# Patient Record
Sex: Female | Born: 1935 | Race: White | Hispanic: No | Smoking: Never smoker
Health system: Southern US, Community
[De-identification: ages and names within clinical notes are randomized; demographics above are authoritative.]

## PROBLEM LIST (undated history)

## (undated) DIAGNOSIS — Z9289 Personal history of other medical treatment: Secondary | ICD-10-CM

## (undated) DIAGNOSIS — N189 Chronic kidney disease, unspecified: Secondary | ICD-10-CM

## (undated) DIAGNOSIS — C801 Malignant (primary) neoplasm, unspecified: Secondary | ICD-10-CM

## (undated) DIAGNOSIS — C4490 Unspecified malignant neoplasm of skin, unspecified: Secondary | ICD-10-CM

## (undated) DIAGNOSIS — I10 Essential (primary) hypertension: Secondary | ICD-10-CM

## (undated) DIAGNOSIS — H409 Unspecified glaucoma: Secondary | ICD-10-CM

## (undated) DIAGNOSIS — K219 Gastro-esophageal reflux disease without esophagitis: Secondary | ICD-10-CM

## (undated) DIAGNOSIS — K222 Esophageal obstruction: Principal | ICD-10-CM

## (undated) HISTORY — PX: COLON SURGERY: SHX602

## (undated) HISTORY — DX: Essential (primary) hypertension: I10

## (undated) HISTORY — DX: Unspecified malignant neoplasm of skin, unspecified: C44.90

## (undated) HISTORY — PX: BREAST EXCISIONAL BIOPSY: SUR124

## (undated) HISTORY — DX: Chronic kidney disease, unspecified: N18.9

## (undated) HISTORY — DX: Gastro-esophageal reflux disease without esophagitis: K21.9

## (undated) HISTORY — DX: Esophageal obstruction: K22.2

## (undated) HISTORY — DX: Personal history of other medical treatment: Z92.89

## (undated) HISTORY — PX: BREAST BIOPSY: SHX20

## (undated) HISTORY — DX: Malignant (primary) neoplasm, unspecified: C80.1

## (undated) HISTORY — DX: Unspecified glaucoma: H40.9

## (undated) HISTORY — PX: BLADDER SUSPENSION: SHX72

---
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
## 1960-06-16 HISTORY — PX: ABDOMINAL HYSTERECTOMY: SHX81

## 2004-05-08 ENCOUNTER — Emergency Department: Payer: Self-pay | Admitting: Internal Medicine

## 2004-05-08 ENCOUNTER — Other Ambulatory Visit: Payer: Self-pay

## 2004-05-15 ENCOUNTER — Ambulatory Visit: Payer: Self-pay | Admitting: Internal Medicine

## 2004-06-03 ENCOUNTER — Ambulatory Visit: Payer: Self-pay

## 2005-04-16 DIAGNOSIS — C4490 Unspecified malignant neoplasm of skin, unspecified: Secondary | ICD-10-CM

## 2005-04-16 HISTORY — DX: Unspecified malignant neoplasm of skin, unspecified: C44.90

## 2006-01-30 ENCOUNTER — Emergency Department: Payer: Self-pay | Admitting: Emergency Medicine

## 2006-01-30 ENCOUNTER — Other Ambulatory Visit: Payer: Self-pay

## 2006-06-16 HISTORY — PX: COLON RESECTION: SHX5231

## 2006-10-14 ENCOUNTER — Ambulatory Visit: Payer: Self-pay

## 2006-11-23 ENCOUNTER — Emergency Department: Payer: Self-pay | Admitting: Emergency Medicine

## 2006-12-08 ENCOUNTER — Ambulatory Visit: Payer: Self-pay | Admitting: Gastroenterology

## 2006-12-22 ENCOUNTER — Ambulatory Visit: Payer: Self-pay | Admitting: Surgery

## 2006-12-29 ENCOUNTER — Ambulatory Visit: Payer: Self-pay | Admitting: Surgery

## 2006-12-29 ENCOUNTER — Other Ambulatory Visit: Payer: Self-pay

## 2007-01-05 ENCOUNTER — Inpatient Hospital Stay: Payer: Self-pay | Admitting: Surgery

## 2007-03-11 LAB — HM COLONOSCOPY

## 2007-07-13 ENCOUNTER — Ambulatory Visit: Payer: Self-pay | Admitting: Internal Medicine

## 2007-07-16 ENCOUNTER — Ambulatory Visit: Payer: Self-pay | Admitting: Internal Medicine

## 2007-07-19 ENCOUNTER — Ambulatory Visit: Payer: Self-pay | Admitting: Internal Medicine

## 2007-08-01 ENCOUNTER — Other Ambulatory Visit: Payer: Self-pay

## 2007-08-02 ENCOUNTER — Inpatient Hospital Stay: Payer: Self-pay | Admitting: Internal Medicine

## 2007-12-21 ENCOUNTER — Ambulatory Visit: Payer: Self-pay | Admitting: Internal Medicine

## 2007-12-29 ENCOUNTER — Ambulatory Visit: Payer: Self-pay | Admitting: Internal Medicine

## 2008-05-06 ENCOUNTER — Ambulatory Visit: Payer: Self-pay | Admitting: Unknown Physician Specialty

## 2008-12-04 ENCOUNTER — Ambulatory Visit: Payer: Self-pay | Admitting: Gastroenterology

## 2009-01-07 LAB — HM COLONOSCOPY: HM Colonoscopy: 2

## 2009-11-16 ENCOUNTER — Ambulatory Visit: Payer: Self-pay | Admitting: Internal Medicine

## 2010-03-20 ENCOUNTER — Ambulatory Visit: Payer: Self-pay | Admitting: Internal Medicine

## 2011-01-26 ENCOUNTER — Emergency Department: Payer: Self-pay | Admitting: Emergency Medicine

## 2011-02-03 ENCOUNTER — Encounter: Payer: Self-pay | Admitting: Internal Medicine

## 2011-02-03 ENCOUNTER — Ambulatory Visit (INDEPENDENT_AMBULATORY_CARE_PROVIDER_SITE_OTHER): Payer: Medicare Other | Admitting: Internal Medicine

## 2011-02-03 VITALS — BP 130/60 | HR 74 | Temp 97.6°F | Resp 12 | Ht 65.0 in | Wt 122.8 lb

## 2011-02-03 DIAGNOSIS — N289 Disorder of kidney and ureter, unspecified: Secondary | ICD-10-CM | POA: Insufficient documentation

## 2011-02-03 DIAGNOSIS — N3090 Cystitis, unspecified without hematuria: Secondary | ICD-10-CM

## 2011-02-03 DIAGNOSIS — D649 Anemia, unspecified: Secondary | ICD-10-CM

## 2011-02-03 LAB — POCT URINALYSIS DIPSTICK
Bilirubin, UA: NEGATIVE
Glucose, UA: NEGATIVE
Ketones, UA: NEGATIVE
Spec Grav, UA: 1.015

## 2011-02-03 MED ORDER — LORAZEPAM 0.5 MG PO TABS: 0.2500 mg | ORAL_TABLET | Freq: Two times a day (BID) | ORAL | Status: DC | PRN

## 2011-02-03 NOTE — Patient Instructions (Signed)
stop the triamterene.  It is causing dehydration. You need to drink more fluids. Take the lorazepam for nausea and for trouble sleeping

## 2011-02-03 NOTE — Assessment & Plan Note (Signed)
Likely due to decreased po intake.  Stop diuretic, repeat labs in  2 weeks

## 2011-02-03 NOTE — Progress Notes (Signed)
  Subjective:    Patient ID: Rachel Reynolds, female    DOB: Feb 05, 1927, 77 y.o.   MRN: [id number]  HPI    Review of Systems     Objective:   Physical Exam        Assessment & Plan:   Subjective:     Rachel Reynolds is a 77 y.o. female who presents for evaluation of fatigue. Symptoms began a week ago. The patient feels the fatigue began with: sudden loss of husband Homero Fellers who died after discharge frombrief hospital stay  following blunt trauma to to the head . Symptoms of her fatigue have been anxiousness, change in appetite, feelings of depression, general malaise and lack of interest in usual activities. Patient describes the following psychological symptoms: depression. Patient denies loss of appetite, recurrent abdominal pain (chronic). Symptoms have waxed and waned. Symptom severity: symptoms bothersome, but easily able to carry out all usual work/school/family activities. Previous visits for this problem: multiple, this is a longstanding diagnosis. Last seen 3 months ago by me.   The following portions of the patient's history were reviewed and updated as appropriate: allergies, current medications, past family history, past medical history, past social history, past surgical history and problem list.  Review of Systems Pertinent items are noted in HPI.    Objective:    BP 130/60  Pulse 74  Temp(Src) 97.6 F (36.4 C) (Oral)  Resp 12  Wt 122 lb 12 oz (55.679 kg)  General Appearance:    Alert, cooperative, no distress, appears stated age  Head:    Normocephalic, without obvious abnormality, atraumatic  Eyes:    PERRL, conjunctiva/corneas clear, EOM's intact, fundi    benign, both eyes       Neck:   Supple, symmetrical, trachea midline, no adenopathy;       thyroid:  No enlargement/tenderness/nodules; no carotid   bruit or JVD  Back:     Symmetric, no curvature, ROM normal, no CVA tenderness  Lungs:     Clear to auscultation bilaterally, respirations unlabored    Chest wall:    No tenderness or deformity  Heart:    Regular rate and rhythm, S1 and S2 normal, no murmur, rub   or gallop  Abdomen:     Soft, non-tender, bowel sounds active all four quadrants,    no masses, no organomegaly  Extremities:   Extremities normal, atraumatic, no cyanosis or edema  Pulses:   2+ and symmetric all extremities  Skin:   Skin color, texture, turgor normal, no rashes or lesions  Lymph nodes:   Cervical, supraclavicular, and axillary nodes normal  Neurologic:   CNII-XII intact. Normal strength, sensation and reflexes      throughout      Assessment:    Fatigue, organic etiology unlikely based on careful history and exam. Anxiety is likely contributing to the problem.    Plan:  Trial of lorazepam for anxiety, nausea and insomnia.  Follow up in 2 weeks or as needed.

## 2011-02-03 NOTE — Assessment & Plan Note (Signed)
Found incidentally on ER visit with hgb 10.5.  Will defer workup for now given patients request.

## 2011-02-10 ENCOUNTER — Telehealth: Payer: Self-pay | Admitting: Internal Medicine

## 2011-02-10 NOTE — Telephone Encounter (Signed)
Patient says that since coming off of the triamterene her feet and ankles have been swelling. She is asking if she can go back on it. Please advise.

## 2011-02-10 NOTE — Telephone Encounter (Signed)
?   About meds  Sent to [facility]

## 2011-02-10 NOTE — Telephone Encounter (Signed)
She can resume the triamterene every other day for her swelling.

## 2011-02-11 NOTE — Telephone Encounter (Signed)
She can increase the lorazepam to 2 tablets at bedtime and continue 1 tablet during the day.  I cannot comment on the bp unless I know how high it is going,  But when she resumes the triamterene it should come down.

## 2011-02-11 NOTE — Telephone Encounter (Signed)
Notified patient she can resume the triamterene every other day for swelling.  She stated her anxiety is really giving her a hard time and she stays dizzy and "gittery" all the time.  She wanted to know what she needs to do.  Please advise.

## 2011-02-11 NOTE — Telephone Encounter (Signed)
Patient stated she has been taking the lorazepam 1/2 tablet two times a day, but last night she took a whole tablet and it still didn't help her sleep any longer.  She also wanted you to be aware that her BP has been elevated at night also.  Please advise.

## 2011-02-11 NOTE — Telephone Encounter (Signed)
She was prescribed lorazepam at last visit for anxiety.  Has she tried it?

## 2011-02-12 NOTE — Telephone Encounter (Signed)
Patient notified

## 2011-02-13 ENCOUNTER — Telehealth: Payer: Self-pay | Admitting: Internal Medicine

## 2011-02-20 ENCOUNTER — Telehealth: Payer: Self-pay | Admitting: Internal Medicine

## 2011-02-20 NOTE — Telephone Encounter (Signed)
Sherrilyn Rist from [place] called and stated she thinks patient needs more grief counseling, patient has lost more weight.  She wanted to know if you would be ok if [facility] could go out and see patient because they cover her insurance completley and she would not have to pay a co pay with them, like she does with [facility].  Please advise.

## 2011-02-21 ENCOUNTER — Ambulatory Visit: Payer: Medicare Other | Admitting: Internal Medicine

## 2011-02-21 NOTE — Telephone Encounter (Signed)
Absolutely.

## 2011-03-04 ENCOUNTER — Encounter: Payer: Self-pay | Admitting: Internal Medicine

## 2011-03-04 ENCOUNTER — Ambulatory Visit (INDEPENDENT_AMBULATORY_CARE_PROVIDER_SITE_OTHER): Payer: Medicare Other | Admitting: Internal Medicine

## 2011-03-04 DIAGNOSIS — F329 Major depressive disorder, single episode, unspecified: Secondary | ICD-10-CM

## 2011-03-04 DIAGNOSIS — R42 Dizziness and giddiness: Secondary | ICD-10-CM | POA: Insufficient documentation

## 2011-03-04 DIAGNOSIS — F4329 Adjustment disorder with other symptoms: Secondary | ICD-10-CM | POA: Insufficient documentation

## 2011-03-04 MED ORDER — LORAZEPAM 0.5 MG PO TABS: 0.2500 mg | ORAL_TABLET | Freq: Two times a day (BID) | ORAL | Status: DC | PRN

## 2011-03-04 NOTE — Progress Notes (Signed)
  Subjective:    Patient ID: Rachel Reynolds, female    DOB: May 24, 1927, 77 y.o.   MRN: [id number]  HPI  77 yo whte feamle here for 2 week followup on grief reaction due to sudden loss of her husband to [place].  Accompaniedd by Bertram Denver.  She is eating a little better. She refused the grief counseling services offered by [facility] because her son took her to the beach for a long weeken, but since she has returned, she is alone during the day,  Sits in the house with no music or television and cries all day.  Feels unsteady on her foot but has not fallen.      Review of Systems  Constitutional: Positive for appetite change.  Neurological: Positive for weakness and light-headedness.  Psychiatric/Behavioral: Positive for sleep disturbance.       Objective:   Physical Exam  Constitutional: She is oriented to person, place, and time. She appears well-developed and well-nourished.  HENT:  Mouth/Throat: Oropharynx is clear and moist.  Eyes: EOM are normal. Pupils are equal, round, and reactive to light. No scleral icterus.  Neck: Normal range of motion. Neck supple. No JVD present. No thyromegaly present.  Cardiovascular: Normal rate, regular rhythm, normal heart sounds and intact distal pulses.   Pulmonary/Chest: Effort normal and breath sounds normal.  Abdominal: Soft. Bowel sounds are normal. She exhibits no mass. There is no tenderness.  Musculoskeletal: Normal range of motion. She exhibits no edema.  Lymphadenopathy:    She has no cervical adenopathy.  Neurological: She is alert and oriented to person, place, and time.  Skin: Skin is warm and dry.  Psychiatric:       Grieving, tearful          Assessment & Plan:  Grief reaction :  Persistent ,  Has not accepted grief counseling yet,.  Will ask [facility] to offer services again.  coontinue lorazepam prescribed at last visit to help her rest at night since she states that it is not cuasing her to feel sedated  in the morning.  I have recommended that she have family memebers stay with her tduring the day to make sure she eats and has social outlet.  Light headedness:  Neuro and ENT exam are normal. She is not orthostatic by BP or pulse. .  Recommended using her walker until she is more active and steady on her feet.

## 2011-03-25 NOTE — Telephone Encounter (Signed)
Opened in error

## 2011-03-27 ENCOUNTER — Other Ambulatory Visit (INDEPENDENT_AMBULATORY_CARE_PROVIDER_SITE_OTHER): Payer: Medicare Other | Admitting: *Deleted

## 2011-03-27 ENCOUNTER — Telehealth: Payer: Self-pay | Admitting: Internal Medicine

## 2011-03-27 DIAGNOSIS — N390 Urinary tract infection, site not specified: Secondary | ICD-10-CM

## 2011-03-27 LAB — POCT URINALYSIS DIPSTICK
Blood, UA: NEGATIVE
Glucose, UA: NEGATIVE
Nitrite, UA: NEGATIVE
Urobilinogen, UA: 0.2
pH, UA: 7

## 2011-03-27 NOTE — Telephone Encounter (Signed)
Patient states she has a bladder infection could the doctor call in an antibiotic(Cipro).

## 2011-03-27 NOTE — Telephone Encounter (Signed)
Called patient back. Put her on nurse schedule to come in for urine sample.

## 2011-03-28 ENCOUNTER — Telehealth: Payer: Self-pay | Admitting: Internal Medicine

## 2011-03-28 NOTE — Telephone Encounter (Signed)
Notified patient of results 

## 2011-03-28 NOTE — Telephone Encounter (Signed)
Her urine was normal.  No signs of infection

## 2011-03-28 NOTE — Telephone Encounter (Signed)
Wanting lab results

## 2011-04-07 ENCOUNTER — Ambulatory Visit (INDEPENDENT_AMBULATORY_CARE_PROVIDER_SITE_OTHER): Payer: Medicare Other | Admitting: Internal Medicine

## 2011-04-07 ENCOUNTER — Encounter: Payer: Self-pay | Admitting: Internal Medicine

## 2011-04-07 ENCOUNTER — Telehealth: Payer: Self-pay | Admitting: *Deleted

## 2011-04-07 DIAGNOSIS — F329 Major depressive disorder, single episode, unspecified: Secondary | ICD-10-CM

## 2011-04-07 DIAGNOSIS — D649 Anemia, unspecified: Secondary | ICD-10-CM

## 2011-04-07 DIAGNOSIS — N289 Disorder of kidney and ureter, unspecified: Secondary | ICD-10-CM

## 2011-04-07 DIAGNOSIS — Z79899 Other long term (current) drug therapy: Secondary | ICD-10-CM

## 2011-04-07 DIAGNOSIS — F4329 Adjustment disorder with other symptoms: Secondary | ICD-10-CM

## 2011-04-07 DIAGNOSIS — Z862 Personal history of diseases of the blood and blood-forming organs and certain disorders involving the immune mechanism: Secondary | ICD-10-CM

## 2011-04-07 LAB — IRON AND TIBC: %SAT: 24 % (ref 20–55)

## 2011-04-07 MED ORDER — MIRTAZAPINE 7.5 MG PO TABS: 7.5000 mg | ORAL_TABLET | Freq: Every day | ORAL | Status: DC

## 2011-04-07 MED ORDER — MIRTAZAPINE 7.5 MG PO TABS: 7.5000 mg | ORAL_TABLET | Freq: Every day | ORAL | Status: AC

## 2011-04-07 NOTE — Assessment & Plan Note (Signed)
After 3 months she contineus to have poor appetite and malaise.  We discussed a trial of remeron. Starting at 7.5 mg daily at bedtime.  Encourage her to start a walking program and consider volunteering once a week but she doesn't feel she has anything to offer because she moves too slowly.

## 2011-04-07 NOTE — Telephone Encounter (Signed)
Patient says that she forgot to ask you about her hair loss at her appt today. She is asking if it could be from one of her meds, or if you think it is stress from the loss of her husband, or her thyroid.

## 2011-04-07 NOTE — Progress Notes (Signed)
  Subjective:    Patient ID: Rachel Reynolds, female    DOB: Apr 12, 1927, 77 y.o.   MRN: [id number]  HPI  77 yo wf returns for one month followup on complicated grief reaction following the death of her husband Rachel Reynolds several months ago.  She has not lost any more weight but continues tohave poor appetite, generalized weakness and no energy . Still keeping to herself a good bit but her son Rachel Reynolds has taken her out a few times.   Past Medical History  Diagnosis Date  . Hypertension   . Vaginal delivery     4  . Nonmelanoma skin cancer 11/06    precancerous actinic keratosis s/p liquid nitrogen, Dr. Gwen Pounds  . Cancer    Current Outpatient Prescriptions on File Prior to Visit  Medication Sig Dispense Refill  . acetaminophen (TYLENOL) 325 MG tablet Take 650 mg by mouth as needed.        Marland Kitchen aspirin 81 MG tablet Take 81 mg by mouth daily.        . calcium carbonate (OS-CAL) 600 MG TABS Take 600 mg by mouth 2 (two) times daily with a meal.        . enalapril (VASOTEC) 10 MG tablet Take 10 mg by mouth daily.        Marland Kitchen LORazepam (ATIVAN) 0.5 MG tablet Take 0.5-1 tablets (0.25-0.5 mg total) by mouth 2 (two) times daily as needed for anxiety (OR nausea). start with half a tablet  60 tablet  1  . omeprazole (PRILOSEC) 20 MG capsule Take 20 mg by mouth daily.        Marland Kitchen triamterene-hydrochlorothiazide (MAXZIDE-25) 37.5-25 MG per tablet Take 1 tablet by mouth daily.          Review of Systems  Constitutional: Positive for appetite change. Negative for unexpected weight change.  Gastrointestinal: Positive for diarrhea and abdominal distention.  Genitourinary: Positive for decreased urine volume.  Neurological: Positive for weakness and light-headedness.  Psychiatric/Behavioral: Positive for sleep disturbance.       Objective:   Physical Exam  Constitutional: She is oriented to person, place, and time. She appears well-developed and well-nourished.  HENT:  Mouth/Throat: Oropharynx is clear and  moist.  Eyes: EOM are normal. Pupils are equal, round, and reactive to light. No scleral icterus.  Neck: Normal range of motion. Neck supple. No JVD present. No thyromegaly present.  Cardiovascular: Normal rate, regular rhythm, normal heart sounds and intact distal pulses.   Pulmonary/Chest: Effort normal and breath sounds normal.  Abdominal: Soft. Bowel sounds are normal. She exhibits no mass. There is no tenderness.  Musculoskeletal: Normal range of motion. She exhibits no edema.  Lymphadenopathy:    She has no cervical adenopathy.  Neurological: She is alert and oriented to person, place, and time.  Skin: Skin is warm and dry.  Psychiatric:       Grieving, tearful          Assessment & Plan:

## 2011-04-07 NOTE — Telephone Encounter (Signed)
Hair loss can be due to thyroid, which we are checking today,  But also due to stress.

## 2011-04-07 NOTE — Telephone Encounter (Signed)
Patient notified

## 2011-04-07 NOTE — Patient Instructions (Signed)
i am recommending that you start mirtazipine 7.5 mg at bedtime .  This is for depression and poor appetite and low energy  Do not take the lorazepam at bedtime until you know how the mirtazipine affects you.  Return in one month

## 2011-04-08 LAB — CBC WITH DIFFERENTIAL/PLATELET
Basophils Relative: 0.3 % (ref 0.0–3.0)
Eosinophils Relative: 0.9 % (ref 0.0–5.0)
HCT: 31.7 % — ABNORMAL LOW (ref 36.0–46.0)
Hemoglobin: 11 g/dL — ABNORMAL LOW (ref 12.0–15.0)
Lymphs Abs: 1.2 10*3/uL (ref 0.7–4.0)
Monocytes Relative: 8.1 % (ref 3.0–12.0)
Neutro Abs: 3.1 10*3/uL (ref 1.4–7.7)
WBC: 4.8 10*3/uL (ref 4.5–10.5)

## 2011-04-08 LAB — BASIC METABOLIC PANEL
BUN: 30 mg/dL — ABNORMAL HIGH (ref 6–23)
CO2: 30 mEq/L (ref 19–32)
Chloride: 101 mEq/L (ref 96–112)
Creatinine, Ser: 1.3 mg/dL — ABNORMAL HIGH (ref 0.4–1.2)

## 2011-04-08 LAB — TSH: TSH: 0.52 u[IU]/mL (ref 0.35–5.50)

## 2011-04-09 ENCOUNTER — Other Ambulatory Visit: Payer: Self-pay | Admitting: Internal Medicine

## 2011-04-09 DIAGNOSIS — N179 Acute kidney failure, unspecified: Secondary | ICD-10-CM

## 2011-04-10 ENCOUNTER — Telehealth: Payer: Self-pay | Admitting: Internal Medicine

## 2011-04-10 NOTE — Telephone Encounter (Signed)
Patient called the rx for mirtazapine 75mg  you gave her was to strong.  She was supposed to take 1 tablet at bedtime  and she took 1/2 and it made her sleep till 9am the next morning she went  About 10.

## 2011-04-10 NOTE — Telephone Encounter (Signed)
Ask her to take it at 8 PM,  Half a tablet, and give it a week before she gives up on it.  The sedation should get better. She should not taker her lorazepam in the evenign while she is trying this/

## 2011-04-10 NOTE — Telephone Encounter (Signed)
Notified patient of the message 

## 2011-04-17 DIAGNOSIS — Z9289 Personal history of other medical treatment: Secondary | ICD-10-CM

## 2011-04-17 HISTORY — DX: Personal history of other medical treatment: Z92.89

## 2011-04-19 ENCOUNTER — Emergency Department: Payer: Self-pay | Admitting: Emergency Medicine

## 2011-04-24 ENCOUNTER — Other Ambulatory Visit: Payer: Medicare Other

## 2011-04-24 ENCOUNTER — Encounter: Payer: Self-pay | Admitting: Internal Medicine

## 2011-04-25 ENCOUNTER — Other Ambulatory Visit (INDEPENDENT_AMBULATORY_CARE_PROVIDER_SITE_OTHER): Payer: Medicare Other | Admitting: *Deleted

## 2011-04-25 DIAGNOSIS — N289 Disorder of kidney and ureter, unspecified: Secondary | ICD-10-CM

## 2011-04-25 DIAGNOSIS — N179 Acute kidney failure, unspecified: Secondary | ICD-10-CM

## 2011-04-25 LAB — CBC WITH DIFFERENTIAL/PLATELET
Basophils Relative: 0.4 % (ref 0.0–3.0)
Eosinophils Absolute: 0.1 10*3/uL (ref 0.0–0.7)
HCT: 32.4 % — ABNORMAL LOW (ref 36.0–46.0)
Hemoglobin: 11.1 g/dL — ABNORMAL LOW (ref 12.0–15.0)
Lymphocytes Relative: 23.2 % (ref 12.0–46.0)
MCHC: 34.4 g/dL (ref 30.0–36.0)
Neutro Abs: 3.3 10*3/uL (ref 1.4–7.7)
RBC: 3.48 Mil/uL — ABNORMAL LOW (ref 3.87–5.11)

## 2011-04-25 LAB — BASIC METABOLIC PANEL
BUN: 23 mg/dL (ref 6–23)
CO2: 26 mEq/L (ref 19–32)
Chloride: 103 mEq/L (ref 96–112)
Creatinine, Ser: 1.1 mg/dL (ref 0.4–1.2)

## 2011-04-26 ENCOUNTER — Inpatient Hospital Stay: Payer: Self-pay | Admitting: Specialist

## 2011-05-09 ENCOUNTER — Ambulatory Visit (INDEPENDENT_AMBULATORY_CARE_PROVIDER_SITE_OTHER): Payer: Medicare Other | Admitting: Internal Medicine

## 2011-05-09 ENCOUNTER — Encounter: Payer: Self-pay | Admitting: Internal Medicine

## 2011-05-09 ENCOUNTER — Ambulatory Visit: Payer: Medicare Other | Admitting: Internal Medicine

## 2011-05-09 VITALS — BP 150/79 | HR 76 | Temp 98.1°F | Resp 14 | Ht 61.0 in | Wt 122.5 lb

## 2011-05-09 DIAGNOSIS — N760 Acute vaginitis: Secondary | ICD-10-CM

## 2011-05-09 MED ORDER — FLUCONAZOLE 100 MG PO TABS: 150.0000 mg | ORAL_TABLET | Freq: Every day | ORAL | Status: AC

## 2011-05-09 NOTE — Patient Instructions (Addendum)
I think your headache and sore throat are coming from a viral infection.  I would like you to try alternating  Between tylenol Sinus and advil sinus for relief of congestion and headache  pain.  Also try gargling with salt water 2 or 3 times daily for the scratchy throat.    If you develop a cough,  You may use Delsym available over the counter.  If you develop fevers (t > 100.4 ) please call us back for an antibiotic
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
## 2011-05-12 ENCOUNTER — Other Ambulatory Visit: Payer: Self-pay | Admitting: Internal Medicine

## 2011-05-12 ENCOUNTER — Ambulatory Visit: Payer: Medicare Other | Admitting: Internal Medicine

## 2011-05-12 MED ORDER — ENALAPRIL MALEATE 10 MG PO TABS: 10.0000 mg | ORAL_TABLET | Freq: Every day | ORAL | Status: DC

## 2011-05-12 NOTE — Telephone Encounter (Signed)
Enalapril 10 mg authorized  #90 with 3 refills

## 2011-05-16 ENCOUNTER — Other Ambulatory Visit: Payer: Self-pay | Admitting: Internal Medicine

## 2011-07-14 ENCOUNTER — Encounter: Payer: Self-pay | Admitting: Internal Medicine

## 2011-07-14 ENCOUNTER — Ambulatory Visit (INDEPENDENT_AMBULATORY_CARE_PROVIDER_SITE_OTHER): Payer: Medicare Other | Admitting: Internal Medicine

## 2011-07-14 VITALS — BP 130/56 | HR 88 | Temp 98.5°F | Wt 122.0 lb

## 2011-07-14 DIAGNOSIS — J329 Chronic sinusitis, unspecified: Secondary | ICD-10-CM | POA: Insufficient documentation

## 2011-07-14 MED ORDER — AMOXICILLIN-POT CLAVULANATE 875-125 MG PO TABS: 1.0000 | ORAL_TABLET | Freq: Two times a day (BID) | ORAL | Status: AC

## 2011-07-14 NOTE — Assessment & Plan Note (Signed)
Symptoms are most consistent with frontal sinusitis. Will treat with Augmentin. Encouraged her to use Tylenol as needed for pain. Patient will call or return to clinic if symptoms are not improving in the next 72 hours.

## 2011-07-14 NOTE — Patient Instructions (Signed)

## 2011-07-14 NOTE — Progress Notes (Signed)
Subjective:    Patient ID: Rachel Reynolds, female    DOB: 02/08/1927, 78 y.o.   MRN: [id number]  HPI 78 year old female presents for an acute visit complaining of a three-day history of nasal congestion, purulent nasal drainage, and sinus pressure. She reports symptoms first began on Saturday. They started with facial pain. She then developed sinus congestion and drainage. She denies any fever or chills. She reports that symptoms are consistent with her previous history of sinusitis. She has occasional cough which is nonproductive. She denies shortness of breath. She denies any known sick contacts. She has not been taking any medication for her symptoms.  Outpatient Encounter Prescriptions as of 07/14/2011  Medication Sig Dispense Refill  . acetaminophen (TYLENOL) 325 MG tablet Take 650 mg by mouth as needed.        Marland Kitchen aspirin 81 MG tablet Take 81 mg by mouth daily.        . calcium carbonate (OS-CAL) 600 MG TABS Take 600 mg by mouth 2 (two) times daily with a meal.        . enalapril (VASOTEC) 10 MG tablet Take 1 tablet (10 mg total) by mouth daily.  90 tablet  2  . omeprazole (PRILOSEC) 20 MG capsule Take 20 mg by mouth daily.        Marland Kitchen triamterene-hydrochlorothiazide (MAXZIDE-25) 37.5-25 MG per tablet Take 1 tablet by mouth daily.        Marland Kitchen amoxicillin-clavulanate (AUGMENTIN) 875-125 MG per tablet Take 1 tablet by mouth 2 (two) times daily.  20 tablet  0    Review of Systems  Constitutional: Negative for fever, chills and unexpected weight change.  HENT: Positive for congestion and sinus pressure. Negative for hearing loss, ear pain, nosebleeds, sore throat, facial swelling, rhinorrhea, sneezing, mouth sores, trouble swallowing, neck pain, neck stiffness, voice change, postnasal drip, tinnitus and ear discharge.   Eyes: Negative for pain, discharge, redness and visual disturbance.  Respiratory: Positive for cough. Negative for chest tightness, shortness of breath, wheezing and stridor.     Cardiovascular: Negative for chest pain, palpitations and leg swelling.  Musculoskeletal: Negative for myalgias and arthralgias.  Skin: Negative for color change and rash.  Neurological: Positive for headaches. Negative for dizziness, weakness and light-headedness.  Hematological: Negative for adenopathy.   BP 130/56  Pulse 88  Temp(Src) 98.5 F (36.9 C) (Oral)  Wt 122 lb (55.339 kg)  SpO2 97%     Objective:   Physical Exam  Constitutional: She is oriented to person, place, and time. She appears well-developed and well-nourished. No distress.  HENT:  Head: Normocephalic and atraumatic.  Right Ear: External ear normal. A middle ear effusion is present.  Left Ear: External ear normal. A middle ear effusion is present.  Nose: Right sinus exhibits maxillary sinus tenderness and frontal sinus tenderness. Left sinus exhibits maxillary sinus tenderness and frontal sinus tenderness.  Mouth/Throat: Oropharynx is clear and moist. No oropharyngeal exudate.  Eyes: Conjunctivae are normal. Pupils are equal, round, and reactive to light. Right eye exhibits no discharge. Left eye exhibits no discharge. No scleral icterus.  Neck: Normal range of motion. Neck supple. No tracheal deviation present. No thyromegaly present.  Cardiovascular: Normal rate, regular rhythm, normal heart sounds and intact distal pulses.  Exam reveals no gallop and no friction rub.   No murmur heard. Pulmonary/Chest: Effort normal and breath sounds normal. No respiratory distress. She has no wheezes. She has no rales. She exhibits no tenderness.  Musculoskeletal: Normal range of motion. She exhibits  no edema and no tenderness.  Lymphadenopathy:    She has no cervical adenopathy.  Neurological: She is alert and oriented to person, place, and time. No cranial nerve deficit. She exhibits normal muscle tone. Coordination normal.  Skin: Skin is warm and dry. No rash noted. She is not diaphoretic. No erythema. No pallor.   Psychiatric: She has a normal mood and affect. Her behavior is normal. Judgment and thought content normal.          Assessment & Plan:

## 2011-07-17 ENCOUNTER — Telehealth: Payer: Self-pay | Admitting: *Deleted

## 2011-07-17 NOTE — Telephone Encounter (Signed)
Pt c/o nausea when taking second dose of augmentin. I told her that this is a normal side effect of the med. She reports symptom is mild and will try to take w/food and call me back w/update. I stressed importance of completing the full course of abx, pt understood and will call back if symptoms were not tolerable.

## 2011-07-17 NOTE — Telephone Encounter (Signed)
I think that is reasonable. If nausea persists, then we can try another antibiotic, or change to plain amoxicillin.

## 2011-07-22 ENCOUNTER — Telehealth: Payer: Self-pay | Admitting: Internal Medicine

## 2011-07-22 NOTE — Telephone Encounter (Signed)
[facility] Triage Call Report Triage Record Num: [id number] Operator: Albertine Grates Patient Name: Rachel Reynolds Call Date & Time: 07/21/2011 6:11:24PM Patient Phone: [phone] PCP: Duncan Dull Patient Gender: Female PCP Fax : [phone] Patient DOB: Jun 25, 1926 Practice Name: [facility] Reason for Call: Caller: Mairin/Patient is calling with a question about Amoxicillin.The medication was written by Ronna Polio. Was seen in office 1-28 and MD advised to keep taking antibiotic that was prescribed for sinus infection. HAs been taking with milk at beginning of meal. Is Amoxicillin. Called back to office and was told to continue taking. Was also told to call back and let MD know how is doing. Continues to take medicine and feels better but will occasionally cause nausea. No vomiting. Only called to let MD know will finish medicine 2-7. is DRINKING/VOIDING WELL. aBLE TO TAKE USUAL MEDS. Wants to know if needs to continue for next 3 days. PLEASE CALL AND ADVISE [phone] Protocol(s) Used: Nausea or Vomiting Recommended Outcome per Protocol: Call Provider within 24 Hours Reason for Outcome: Symptoms began after starting or changing dose of prescription, nonprescription, alternative medication, or illicit drug Care Advice: ~

## 2011-07-22 NOTE — Telephone Encounter (Signed)
She should take the amoxiciliin at the end of the meal,  And finsih the prescription

## 2011-07-22 NOTE — Telephone Encounter (Signed)
Advised patient

## 2011-08-08 ENCOUNTER — Ambulatory Visit (INDEPENDENT_AMBULATORY_CARE_PROVIDER_SITE_OTHER): Payer: Medicare Other | Admitting: Internal Medicine

## 2011-08-08 ENCOUNTER — Encounter: Payer: Self-pay | Admitting: Internal Medicine

## 2011-08-08 DIAGNOSIS — I10 Essential (primary) hypertension: Secondary | ICD-10-CM

## 2011-08-08 DIAGNOSIS — R42 Dizziness and giddiness: Secondary | ICD-10-CM

## 2011-08-08 DIAGNOSIS — F329 Major depressive disorder, single episode, unspecified: Secondary | ICD-10-CM

## 2011-08-08 DIAGNOSIS — F4329 Adjustment disorder with other symptoms: Secondary | ICD-10-CM

## 2011-08-08 NOTE — Patient Instructions (Signed)
Decrease your enalapril to 1/2 tablet daily if it can be cut in half.  Otherwise , stop it completely , and recheck your blood pressure in one week.   If your blood pressure remains  < 140/80,  Do not resume the enalapril.
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
## 2011-08-10 ENCOUNTER — Encounter: Payer: Self-pay | Admitting: Internal Medicine

## 2011-08-10 DIAGNOSIS — I10 Essential (primary) hypertension: Secondary | ICD-10-CM | POA: Insufficient documentation

## 2011-08-10 NOTE — Assessment & Plan Note (Signed)
I suspect secondary to hypotension. She lives alone, has had no recent falls.  Anxiolytics have been discontinued. BP medications adjusted today

## 2011-08-10 NOTE — Assessment & Plan Note (Signed)
We have reduced her medications today to 1/2 enalapril tablet today due to low bp.

## 2011-08-10 NOTE — Assessment & Plan Note (Signed)
She is tolerating remeron , and is slightly improved. No changes today.

## 2011-08-18 ENCOUNTER — Telehealth: Payer: Self-pay | Admitting: Internal Medicine

## 2011-08-18 NOTE — Telephone Encounter (Signed)
Patient called and scheduled appt to see Dr. Darrick Huntsman tomorrow.

## 2011-08-18 NOTE — Telephone Encounter (Signed)
Office Message [address] [place], [place] [address] p. [phone] f. [phone] To: [facility] (Daytime Triage) Fax: [phone] From: [facility] Date/ Time: 08/18/2011 8:09 AM Taken By: Lillia Abed Renegar, CSR Caller: Santina Evans Facility: not collected Patient: Rachel Reynolds, Rachel Reynolds DOB: [birth year] Phone: [phone] Reason for Call: Dr. Darrick Huntsman had asked her to take just one of her blood pressure pills and to call back with a report for and to schedule an appointment if needed. Please call back at [phone] Regarding Appointment: Appt Date: Appt Time: Unknown Provider: Reason: Details: Outcome:

## 2011-08-19 ENCOUNTER — Ambulatory Visit (INDEPENDENT_AMBULATORY_CARE_PROVIDER_SITE_OTHER): Payer: Medicare Other | Admitting: Internal Medicine

## 2011-08-19 ENCOUNTER — Encounter: Payer: Self-pay | Admitting: Internal Medicine

## 2011-08-19 DIAGNOSIS — Z9289 Personal history of other medical treatment: Secondary | ICD-10-CM | POA: Insufficient documentation

## 2011-08-19 DIAGNOSIS — Z79899 Other long term (current) drug therapy: Secondary | ICD-10-CM

## 2011-08-19 DIAGNOSIS — R208 Other disturbances of skin sensation: Secondary | ICD-10-CM

## 2011-08-19 DIAGNOSIS — D649 Anemia, unspecified: Secondary | ICD-10-CM

## 2011-08-19 DIAGNOSIS — I10 Essential (primary) hypertension: Secondary | ICD-10-CM

## 2011-08-19 DIAGNOSIS — R209 Unspecified disturbances of skin sensation: Secondary | ICD-10-CM

## 2011-08-19 DIAGNOSIS — R42 Dizziness and giddiness: Secondary | ICD-10-CM

## 2011-08-19 LAB — BASIC METABOLIC PANEL WITH GFR
BUN: 28 mg/dL — ABNORMAL HIGH (ref 6–23)
CO2: 26 meq/L (ref 19–32)
Calcium: 9.3 mg/dL (ref 8.4–10.5)
Chloride: 96 meq/L (ref 96–112)
Creatinine, Ser: 1.3 mg/dL — ABNORMAL HIGH (ref 0.4–1.2)
GFR: 42.35 mL/min — ABNORMAL LOW
Glucose, Bld: 106 mg/dL — ABNORMAL HIGH (ref 70–99)
Potassium: 3.5 meq/L (ref 3.5–5.1)
Sodium: 139 meq/L (ref 135–145)

## 2011-08-19 LAB — MAGNESIUM: Magnesium: 1.7 mg/dL (ref 1.5–2.5)

## 2011-08-19 NOTE — Assessment & Plan Note (Addendum)
she is mildly orthostatic,  By by measurement today,  Supine bp is  120  Drops to 110.  And pulse inceases from 72  To 90.  Labs are also suggestive of dehydration.  Reducing maxzide to 1/2 tablet daily.

## 2011-08-19 NOTE — Progress Notes (Signed)
Subjective:    Patient ID: Rachel Reynolds, female    DOB: 10-24-1926, 78 y.o.   MRN: [id number]  HPI  Presents with one week history of left hand and forearm tingling,  somestimes tingling is above the elbow .  No history of trauma or repetitive activity .  Intermittent .  Has had more trouble buttong shirts this week.  Some pain in shoulder and hand.  History of stroke in 2001 affecting left side permamantly with weakness compared to right .  Aslo reporetirng persistent light headedness for several months    Review of Systems  Constitutional: Negative for fever, chills and unexpected weight change.  HENT: Negative for hearing loss, ear pain, nosebleeds, congestion, sore throat, facial swelling, rhinorrhea, sneezing, mouth sores, trouble swallowing, neck pain, neck stiffness, voice change, postnasal drip, sinus pressure, tinnitus and ear discharge.   Eyes: Negative for pain, discharge, redness and visual disturbance.  Respiratory: Negative for cough, chest tightness, shortness of breath, wheezing and stridor.   Cardiovascular: Negative for chest pain, palpitations and leg swelling.  Musculoskeletal: Negative for myalgias and arthralgias.  Skin: Negative for color change and rash.  Neurological: Negative for dizziness, weakness, light-headedness and headaches.  Hematological: Negative for adenopathy.       Objective:   Physical Exam  Constitutional: She is oriented to person, place, and time. She appears well-developed and well-nourished.  HENT:  Mouth/Throat: Oropharynx is clear and moist.  Eyes: EOM are normal. Pupils are equal, round, and reactive to light. No scleral icterus.  Neck: Normal range of motion. Neck supple. No JVD present. No thyromegaly present.  Cardiovascular: Normal rate, regular rhythm, normal heart sounds and intact distal pulses.   Pulmonary/Chest: Effort normal and breath sounds normal.  Abdominal: Soft. Bowel sounds are normal. She exhibits no mass. There is  no tenderness.  Musculoskeletal: Normal range of motion. She exhibits no edema.  Lymphadenopathy:    She has no cervical adenopathy.  Neurological: She is alert and oriented to person, place, and time. She has normal reflexes. She displays normal reflexes. No cranial nerve deficit. She exhibits normal muscle tone. Coordination normal.  Skin: Skin is warm and dry.  Psychiatric: She has a normal mood and affect.      Assessment & Plan:   Dizziness - light-headed she is mildly orthostatic,  By by measurement today,  Supine bp is  120  Drops to 110.  And pulse inceases from 72  To 90.  Labs are also suggestive of dehydration.  Reducing maxzide to 1/2 tablet daily.   Hypertension diastolic Has been takig 1/2 enalapril and whole maxzide .  Will reduce the maxzide dose  to 1/2 tablet daily   Dysesthesia Her neurologic exam is entirely normal.  There is no reason to repeat an MRI at this time.  If her symptoms continue will consider EMG/nerve conduction studies vs cervical spine films vs serologic workup for neuropathy.    Updated Medication List Outpatient Encounter Prescriptions as of 08/19/2011  Medication Sig Dispense Refill  . aspirin 81 MG tablet Take 81 mg by mouth daily.        . calcium carbonate (OS-CAL) 600 MG TABS Take 600 mg by mouth 2 (two) times daily with a meal.        . enalapril (VASOTEC) 10 MG tablet Take 5 mg by mouth daily.      Marland Kitchen omeprazole (PRILOSEC) 20 MG capsule Take 20 mg by mouth daily.        Marland Kitchen triamterene-hydrochlorothiazide (MAXZIDE-25)  37.5-25 MG per tablet Take 1 tablet by mouth daily.       Marland Kitchen DISCONTD: enalapril (VASOTEC) 10 MG tablet Take 1 tablet (10 mg total) by mouth daily.  90 tablet  2  . DISCONTD: acetaminophen (TYLENOL) 325 MG tablet Take 650 mg by mouth as needed.

## 2011-08-19 NOTE — Assessment & Plan Note (Signed)
Her neurologic exam is entirely normal.  There is no reason to repeat an MRI at this time.  If her symptoms continue will consider EMG/nerve conduction studies vs cervical spine films vs serologic workup for neuropathy.

## 2011-08-19 NOTE — Patient Instructions (Addendum)
Your blood pressure is dropping too much when you stand up.  Your home numbers are much higher than our readings and I think they are wrong  Please cut your maxzide tablet in half  And take only half a tablet daily,  If this is not possible,  Call me and I will change your medication to a different diuretic.  Please try to drink  48 ounces of water daily to keep your hydration up,

## 2011-08-19 NOTE — Assessment & Plan Note (Signed)
Has been takig 1/2 enalapril and whole maxzide .  Will reduce the maxzide dose  to 1/2 tablet daily

## 2011-08-20 ENCOUNTER — Encounter: Payer: Self-pay | Admitting: *Deleted

## 2011-08-25 ENCOUNTER — Telehealth: Payer: Self-pay | Admitting: Internal Medicine

## 2011-08-25 DIAGNOSIS — R200 Anesthesia of skin: Secondary | ICD-10-CM

## 2011-08-25 NOTE — Telephone Encounter (Signed)
Patient called and wanted to let you know her hand is still tingling, "it doesn't hurt, its just a nuisance".  She didn't know if she could have a pinched nerve.

## 2011-08-25 NOTE — Telephone Encounter (Signed)
Possibly,  We will need to refer her to a neurologist for EMG/nerve conduction studies to determine that.  Would she like to proceed.

## 2011-08-26 NOTE — Telephone Encounter (Signed)
Order placed in EPIC for referral to neurologisit Dr. Sherryll Burger or Dr. Chestine Spore in [place].

## 2011-08-26 NOTE — Telephone Encounter (Signed)
Patient wants to proceed with the referral.  Please advise.

## 2011-08-27 NOTE — Telephone Encounter (Signed)
Patient notified

## 2011-08-28 ENCOUNTER — Telehealth: Payer: Self-pay | Admitting: Internal Medicine

## 2011-08-28 NOTE — Telephone Encounter (Signed)
I advised patient she does not need a orthopedic evaluation for hand tingling she needs the neurology evaluation as Dr. Darrick Huntsman already advised.  That appt is being set up.

## 2011-08-28 NOTE — Telephone Encounter (Signed)
[phone] Pt called to let you know that she does not need neurology she needs othopedic dr  In [place] She would like to to go [place] [facility]  She would like to get the  Appointment with  dr Ovid Curd Katrinka Blazing if possible

## 2011-11-04 ENCOUNTER — Ambulatory Visit (INDEPENDENT_AMBULATORY_CARE_PROVIDER_SITE_OTHER): Payer: Medicare Other | Admitting: Internal Medicine

## 2011-11-04 ENCOUNTER — Encounter: Payer: Self-pay | Admitting: Internal Medicine

## 2011-11-04 VITALS — BP 118/58 | HR 79 | Temp 98.3°F | Resp 12 | Wt 124.5 lb

## 2011-11-04 DIAGNOSIS — I10 Essential (primary) hypertension: Secondary | ICD-10-CM

## 2011-11-04 DIAGNOSIS — R209 Unspecified disturbances of skin sensation: Secondary | ICD-10-CM

## 2011-11-04 DIAGNOSIS — R208 Other disturbances of skin sensation: Secondary | ICD-10-CM

## 2011-11-04 MED ORDER — TRIAMTERENE-HCTZ 37.5-25 MG PO TABS: 0.5000 | ORAL_TABLET | Freq: Every day | ORAL | Status: DC

## 2011-11-04 MED ORDER — ENALAPRIL MALEATE 5 MG PO TABS: 5.0000 mg | ORAL_TABLET | Freq: Every day | ORAL | Status: DC

## 2011-11-04 NOTE — Progress Notes (Signed)
Patient ID: Rachel Reynolds, female   DOB: 10-16-1926, 78 y.o.   MRN: [id number]  Patient Active Problem List  Diagnoses  . Anemia  . Acute renal insufficiency  . Dizziness - light-headed  . Prolonged grief reaction  . Sinusitis  . Hypertension diastolic  . History of MRI of brain and brain stem  . Dysesthesia    Subjective:  CC:   Chief Complaint  Patient presents with  . Follow-up    HPI:   Rachel Reynolds a 78 y.o. female who presents for follow up on chronic conditions.  Her left thumb was previously reported as numb, but has now developed pain that radiates to  Shoulder.  She has developed some difficulty buttoning things and lifting the arm above the shoulder.  The shoulder pain is aggravated by lying in bed on same side,  She denies neck pain. Marland Kitchen Has stopped driving because she fears the pain will affect her driving. She has an appt to see a neurologist on May 29th.  Some trouble sleeping secondary to pain ,.  No fevers, uninentional wt loss, changes in bowel or bladder habits.  Chronic loose stools has not changed since her abdominal surgery many years ago.    Past Medical History  Diagnosis Date  . Hypertension   . Vaginal delivery     4  . Nonmelanoma skin cancer 11/06    precancerous actinic keratosis s/p liquid nitrogen, Dr. Gwen Pounds  . Cancer   . History of MRI of brain and brain stem Nov 2012    no evidence of macroangiopathy    Past Surgical History  Procedure Date  . Colon surgery   . Colon resection 2008    proximal due to precancerous polyp  . Abdominal hysterectomy 1962    due to hemorrhagic  . Appendectomy 1948  . Bladder suspension          The following portions of the patient's history were reviewed and updated as appropriate: Allergies, current medications, and problem list.    Review of Systems:   12 Pt  review of systems was negative except those addressed in the HPI,     History   Social History  . Marital Status:  Single    Spouse Name: N/A    Number of Children: N/A  . Years of Education: N/A   Occupational History  . Retired     Neurosurgeon, Midwife at [employer]   Social History Main Topics  . Smoking status: Never Smoker   . Smokeless tobacco: Never Used  . Alcohol Use: No  . Drug Use: No  . Sexually Active: Not on file   Other Topics Concern  . Not on file   Social History Narrative  . No narrative on file    Objective:  BP 118/58  Pulse 79  Temp(Src) 98.3 F (36.8 C) (Oral)  Resp 12  Wt 124 lb 8 oz (56.473 kg)  SpO2 96%  General appearance: alert, cooperative and appears stated age Ears: normal TM's and external ear canals both ears Throat: lips, mucosa, and tongue normal; teeth and gums normal Neck: no adenopathy, no carotid bruit, supple, symmetrical, trachea midline and thyroid not enlarged, symmetric, no tenderness/mass/nodules Back: symmetric, no curvature. ROM normal. No CVA tenderness. Lungs: clear to auscultation bilaterally Heart: regular rate and rhythm, S1, S2 normal, no murmur, click, rub or gallop Abdomen: soft, non-tender; bowel sounds normal; no masses,  no organomegaly Pulses: 2+ and symmetric Skin: Skin color, texture, turgor normal. No  rashes or lesions Lymph nodes: Cervical, supraclavicular, and axillary nodes normal.  Assessment and Plan:  Dysesthesia Her current  left arm symptoms suggest a peripheral neuropathy, and she is scheduled to see a neurologist next week for EMG/nervoe conduction studies. B12 and thyroid studies were done recently and normal (TSH was 0.35 but T4 was 1.2)  Hypertension diastolic Well controlled on current medications.  No changes today.  Maxzide dose was reduced after last visit due to bump in GFR and she needs to return for repeat next month    Updated Medication List Outpatient Encounter Prescriptions as of 11/04/2011  Medication Sig Dispense Refill  . acetaminophen (TYLENOL) 325 MG tablet Take 650 mg by mouth  every 6 (six) hours as needed.      Marland Kitchen aspirin 81 MG tablet Take 81 mg by mouth daily.        . calcium carbonate (OS-CAL) 600 MG TABS Take 600 mg by mouth 2 (two) times daily with a meal.        . enalapril (VASOTEC) 5 MG tablet Take 1 tablet (5 mg total) by mouth daily.  30 tablet  11  . omeprazole (PRILOSEC) 20 MG capsule Take 20 mg by mouth daily.        Marland Kitchen triamterene-hydrochlorothiazide (MAXZIDE-25) 37.5-25 MG per tablet Take 0.5 each (0.5 tablets total) by mouth daily.  30 tablet  11  . vitamin B-12 (CYANOCOBALAMIN) 100 MCG tablet Take 50 mcg by mouth daily.      Marland Kitchen DISCONTD: enalapril (VASOTEC) 10 MG tablet Take 5 mg by mouth daily.      Marland Kitchen DISCONTD: triamterene-hydrochlorothiazide (MAXZIDE-25) 37.5-25 MG per tablet Take 1 tablet by mouth daily.          No orders of the defined types were placed in this encounter.    No Follow-up on file.

## 2011-11-04 NOTE — Patient Instructions (Signed)
Please return at your leisure for a repeat thyroid check;  you do not have to fast.

## 2011-11-07 NOTE — Assessment & Plan Note (Addendum)
Her current  left arm symptoms suggest a peripheral neuropathy, and she is scheduled to see a neurologist next week for EMG/nervoe conduction studies. B12 and thyroid studies were done recently and normal (TSH was 0.35 but T4 was 1.2)

## 2011-11-07 NOTE — Assessment & Plan Note (Signed)
Well controlled on current medications.  No changes today.  Maxzide dose was reduced after last visit due to bump in GFR and she needs to return for repeat next month

## 2011-11-11 ENCOUNTER — Telehealth: Payer: Self-pay | Admitting: Internal Medicine

## 2011-11-11 NOTE — Telephone Encounter (Signed)
Patient found papers in reference to her thyroid testing and has questions.

## 2011-11-11 NOTE — Telephone Encounter (Signed)
Spoke with patient she is coming in tomorrow per Dr. Darrick Huntsman to recheck her thyroid level.

## 2011-11-12 ENCOUNTER — Other Ambulatory Visit (INDEPENDENT_AMBULATORY_CARE_PROVIDER_SITE_OTHER): Payer: Medicare Other | Admitting: *Deleted

## 2011-11-12 DIAGNOSIS — I10 Essential (primary) hypertension: Secondary | ICD-10-CM

## 2011-11-13 ENCOUNTER — Other Ambulatory Visit: Payer: Self-pay | Admitting: Internal Medicine

## 2011-12-24 ENCOUNTER — Other Ambulatory Visit: Payer: Self-pay | Admitting: Internal Medicine

## 2011-12-24 MED ORDER — DIPHENOXYLATE-ATROPINE 2.5-0.025 MG PO TABS: 1.0000 | ORAL_TABLET | Freq: Four times a day (QID) | ORAL | Status: AC | PRN

## 2011-12-26 ENCOUNTER — Emergency Department: Payer: Self-pay | Admitting: Emergency Medicine

## 2011-12-26 LAB — COMPREHENSIVE METABOLIC PANEL
Albumin: 3.9 g/dL (ref 3.4–5.0)
Alkaline Phosphatase: 80 U/L (ref 50–136)
Bilirubin,Total: 0.4 mg/dL (ref 0.2–1.0)
Calcium, Total: 9.1 mg/dL (ref 8.5–10.1)
Chloride: 102 mmol/L (ref 98–107)
Co2: 30 mmol/L (ref 21–32)
Creatinine: 1.26 mg/dL (ref 0.60–1.30)
EGFR (African American): 46 — ABNORMAL LOW
Osmolality: 286 (ref 275–301)
SGOT(AST): 18 U/L (ref 15–37)
SGPT (ALT): 18 U/L
Total Protein: 7 g/dL (ref 6.4–8.2)

## 2011-12-26 LAB — URINALYSIS, COMPLETE
Bilirubin,UR: NEGATIVE
Blood: NEGATIVE
Ketone: NEGATIVE
Nitrite: NEGATIVE
Ph: 7 (ref 4.5–8.0)
Protein: NEGATIVE
Specific Gravity: 1.006 (ref 1.003–1.030)

## 2011-12-26 LAB — CBC
HCT: 34.4 % — ABNORMAL LOW (ref 35.0–47.0)
HGB: 11.7 g/dL — ABNORMAL LOW (ref 12.0–16.0)
MCH: 31.6 pg (ref 26.0–34.0)
MCHC: 34.1 g/dL (ref 32.0–36.0)
MCV: 93 fL (ref 80–100)
Platelet: 147 10*3/uL — ABNORMAL LOW (ref 150–440)
RBC: 3.71 10*6/uL — ABNORMAL LOW (ref 3.80–5.20)
RDW: 13.1 % (ref 11.5–14.5)

## 2012-01-12 ENCOUNTER — Telehealth: Payer: Self-pay | Admitting: Internal Medicine

## 2012-01-12 NOTE — Telephone Encounter (Signed)
Patient is going to scheduled a colonoscopy she states that her insurance company requires her to let her PCP know that she is going to schedule.  She is going to schedule with Dr. Val Eagle at [facility].

## 2012-01-20 ENCOUNTER — Telehealth: Payer: Self-pay | Admitting: Internal Medicine

## 2012-01-20 NOTE — Telephone Encounter (Signed)
Caller: Arlina/Patient; PCP: Duncan Dull; CB#: [phone];[facility] regarding Medication Changed 2. Weeks Ago;" Feel staggery when walking,  diarrhea, and just don't feel good."  I eat and drink normally.  Enalapril, Triamterene /HCTZ are medications changed. Diarrhea with incontinent stools "off and on for a while- 2 or 3 weeks or so".  Taking Dipheniatrop 2.5 mg for diarrhea once daily.  Diarrhea every time I eat - 4 to 5 stools per day.  Advised see provider in 24 hours per Diarrhea or Other Change in Bowel Habits protocol and nursing judgment related to other symptoms.  Appointment at 08:15 on 01/21/12 with Dr. Darrick Huntsman.  Home care for the interim and parameters for callback given.

## 2012-01-21 ENCOUNTER — Ambulatory Visit (INDEPENDENT_AMBULATORY_CARE_PROVIDER_SITE_OTHER): Payer: Medicare Other | Admitting: Internal Medicine

## 2012-01-21 ENCOUNTER — Encounter: Payer: Self-pay | Admitting: Internal Medicine

## 2012-01-21 VITALS — BP 150/72 | HR 80 | Temp 97.9°F | Resp 14 | Wt 121.5 lb

## 2012-01-21 DIAGNOSIS — K529 Noninfective gastroenteritis and colitis, unspecified: Secondary | ICD-10-CM

## 2012-01-21 DIAGNOSIS — R197 Diarrhea, unspecified: Secondary | ICD-10-CM

## 2012-01-21 DIAGNOSIS — K5289 Other specified noninfective gastroenteritis and colitis: Secondary | ICD-10-CM

## 2012-01-21 LAB — CBC WITH DIFFERENTIAL/PLATELET
Eosinophils Relative: 1 % (ref 0–5)
HCT: 35.5 % — ABNORMAL LOW (ref 36.0–46.0)
Hemoglobin: 11.9 g/dL — ABNORMAL LOW (ref 12.0–15.0)
Lymphocytes Relative: 18 % (ref 12–46)
MCHC: 33.5 g/dL (ref 30.0–36.0)
MCV: 90.8 fL (ref 78.0–100.0)
Monocytes Absolute: 0.4 10*3/uL (ref 0.1–1.0)
Monocytes Relative: 9 % (ref 3–12)
Neutro Abs: 3.6 10*3/uL (ref 1.7–7.7)
RDW: 14 % (ref 11.5–15.5)
WBC: 5 10*3/uL (ref 4.0–10.5)

## 2012-01-21 NOTE — Progress Notes (Signed)
Patient ID: Rachel Reynolds, female   DOB: 1926-09-14, 78 y.o.   MRN: [id number] Patient Active Problem List  Diagnosis  . Anemia  . Acute renal insufficiency  . Dizziness - light-headed  . Prolonged grief reaction  . Sinusitis  . Hypertension diastolic  . History of MRI of brain and brain stem  . Dysesthesia    Subjective:  CC:   Chief Complaint  Patient presents with  . Diarrhea    x 2 days    HPI:   Rachel Reynolds a 78 y.o. female who presents with acute on chronic diarrhea with urgency for the past 2 days.    Had some nausea the first day which she treated with medication and resolved.  Symptoms   .  Started on Saturday evening with nausea and malaise.  4 or 5 explosive stools daily.   No recent travel or sick contacts.  No blood i n stools. No cramps or fevers, A.  Some mild dizziness, which is chronic,  But not orthostatic by my check of bp/pulse.  Past Medical History  Diagnosis Date  . Hypertension   . Vaginal delivery     4  . Nonmelanoma skin cancer 11/06    precancerous actinic keratosis s/p liquid nitrogen, Dr. Gwen Pounds  . Cancer   . History of MRI of brain and brain stem Nov 2012    no evidence of macroangiopathy    Past Surgical History  Procedure Date  . Colon surgery   . Colon resection 2008    proximal due to precancerous polyp  . Abdominal hysterectomy 1962    due to hemorrhagic  . Appendectomy 1948  . Bladder suspension          The following portions of the patient's history were reviewed and updated as appropriate: Allergies, current medications, and problem list.    Review of Systems: Review of Systems  Constitutional: Positive for malaise/fatigue. Negative for fever, chills, weight loss and diaphoresis.  HENT: Negative.   Eyes: Negative.   Respiratory: Negative for cough and shortness of breath.   Cardiovascular: Negative for chest pain, orthopnea and leg swelling.  Gastrointestinal: Positive for nausea and diarrhea.  Negative for heartburn and blood in stool.  Genitourinary: Negative for dysuria and flank pain.  Musculoskeletal: Negative for myalgias, joint pain and falls.  Skin: Negative for rash.  Neurological: Positive for dizziness. Negative for weakness and headaches.  Endo/Heme/Allergies: Does not bruise/bleed easily.  Psychiatric/Behavioral: Negative for suicidal ideas. The patient is not nervous/anxious and does not have insomnia.      History   Social History  . Marital Status: Single    Spouse Name: N/A    Number of Children: N/A  . Years of Education: N/A   Occupational History  . Retired     Neurosurgeon, Midwife at [employer]   Social History Main Topics  . Smoking status: Never Smoker   . Smokeless tobacco: Never Used  . Alcohol Use: No  . Drug Use: No  . Sexually Active: Not on file   Other Topics Concern  . Not on file   Social History Narrative   Widowed, lives alone.  . No narrative on file    Objective:  BP 150/72  Pulse 80  Temp 97.9 F (36.6 C) (Oral)  Resp 14  Wt 121 lb 8 oz (55.112 kg)  SpO2 97%  General appearance: alert, cooperative and appears stated age Ears: normal TM's and external ear canals both ears Throat: lips, mucosa, and  tongue normal; teeth and gums normal Neck: no adenopathy, no carotid bruit, supple, symmetrical, trachea midline and thyroid not enlarged, symmetric, no tenderness/mass/nodules Back: symmetric, no curvature. ROM normal. No CVA tenderness. Lungs: clear to auscultation bilaterally Heart: regular rate and rhythm, S1, S2 normal, no murmur, click, rub or gallop Abdomen: soft, touchy" in mid abdomen but no rebound or guarding   bowel sounds normal; no masses,  no organomegaly Pulses: 2+ and symmetric Skin: Skin color, texture, turgor normal. No rashes or lesions Lymph nodes: Cervical, supraclavicular, and axillary nodes normal.  Assessment and Plan:  Diarrhea Her abdominal exam is benign.  She is not orthostatic or  febrile.   labs were normal.  Recommended clear liquid diet for a few days,  Lomotil prn and follow up in one week.  If she has recurrent nausea or develops hypotension or weakness,advised to go to ER<    Updated Medication List Outpatient Encounter Prescriptions as of 01/21/2012  Medication Sig Dispense Refill  . acetaminophen (TYLENOL) 325 MG tablet Take 650 mg by mouth every 6 (six) hours as needed.      Marland Kitchen aspirin 81 MG tablet Take 81 mg by mouth daily.        . calcium carbonate (OS-CAL) 600 MG TABS Take 600 mg by mouth 2 (two) times daily with a meal.        . enalapril (VASOTEC) 5 MG tablet Take 1 tablet (5 mg total) by mouth daily.  30 tablet  11  . omeprazole (PRILOSEC) 20 MG capsule Take 20 mg by mouth daily.        Marland Kitchen triamterene-hydrochlorothiazide (MAXZIDE-25) 37.5-25 MG per tablet Take 0.5 each (0.5 tablets total) by mouth daily.  30 tablet  11  . vitamin B-12 (CYANOCOBALAMIN) 100 MCG tablet Take 50 mcg by mouth daily.

## 2012-01-21 NOTE — Patient Instructions (Addendum)
It appears that you have viral gastroenteritis.  Please change your diet to clear liquids for a few days until your stools are back to normal.Clear Liquid Diet The clear liquid diet consists of foods that are liquid or will become liquid at room temperature. You should be able to see through the liquid and beverages. Examples of foods allowed on a clear liquid diet include fruit juice, broth or bouillon, gelatin, or frozen ice pops. The purpose of this diet is to provide necessary fluid, electrolytes such as sodium and potassium, and energy to keep the body functioning during times when you are not able to consume a regular diet. A clear liquid diet should not be continued for long periods of time as it is not nutritionally adequate.   REASONS FOR USING A CLEAR LIQUID DIET  In sudden onset (acute) conditions for a patient before or after surgery.   As the first step in oral feeding.   For fluid and electrolyte replacement in diarrheal diseases.   As a diet before certain medical tests are performed.  ADEQUACY The clear liquid diet is adequate only in ascorbic acid, according to the Recommended Dietary Allowances of the [employer]. CHOOSING FOODS Breads and Starches  Allowed:  None are allowed.   Avoid: All are avoided.  Vegetables  Allowed:  Strained tomato or vegetable juice.   Avoid: Any others.  Fruit  Allowed:  Strained fruit juices and fruit drinks. Include 1 serving of citrus or vitamin C-enriched fruit juice daily.   Avoid: Any others.  Meat and Meat Substitutes  Allowed:  None are allowed.   Avoid: All are avoided.  Milk  Allowed:  None are allowed.   Avoid: All are avoided.  Soups and Combination Foods  Allowed:  Clear bouillon, broth, or strained broth-based soups.   Avoid: Any others.  Desserts and Sweets  Allowed:  Sugar, honey. High protein gelatin. Flavored gelatin, ices, or frozen ice pops that do not contain milk.   Avoid: Any others.    Fats and Oils  Allowed:  None are allowed.   Avoid: All are avoided.  Beverages  Allowed: Cereal beverages, coffee (regular or decaffeinated), tea, or soda at the discretion of your caregiver.   Avoid: Any others.  Condiments  Allowed:  Iodized salt.   Avoid: Any others, including pepper.  Supplements  Allowed:  Liquid nutrition beverages.   Avoid: Any others that contain lactose or fiber.  SAMPLE MEAL PLAN Breakfast  4 oz (120 mL) strained orange juice.    to 1 cup (125 to 250 mL) gelatin (plain or fortified).   1 cup (250 mL) beverage (coffee or tea).   Sugar, if desired.  Midmorning Snack   cup (125 mL) gelatin (plain or fortified).  Lunch  1 cup (250 mL) broth or consomm.   4 oz (120 mL) strained grapefruit juice.    cup (125 mL) gelatin (plain or fortified).   1 cup (250 mL) beverage (coffee or tea).   Sugar, if desired.  Midafternoon Snack   cup (125 mL) fruit ice.    cup (125 mL) strained fruit juice.  Dinner  1 cup (250 mL) broth or consomm.    cup (125 mL) cranberry juice.    cup (125 mL) flavored gelatin (plain or fortified).   1 cup (250 mL) beverage (coffee or tea).   Sugar, if desired.  Evening Snack  4 oz (120 mL) strained apple juice (vitamin C-fortified).    cup (125 mL) flavored gelatin (  plain or fortified).  Document Released: 06/02/2005 Document Revised: 05/22/2011 Document Reviewed: 08/30/2010 [facility] Patient Information 2012 [place], [place].

## 2012-01-23 DIAGNOSIS — R197 Diarrhea, unspecified: Secondary | ICD-10-CM | POA: Insufficient documentation

## 2012-01-23 NOTE — Assessment & Plan Note (Addendum)
Her abdominal exam is benign.  She is not orthostatic or febrile.   labs were normal.  Recommended clear liquid diet for a few days,  Lomotil prn and follow up in one week.  If she has recurrent nausea or develops hypotension or weakness,advised to go to ER<

## 2012-01-26 ENCOUNTER — Telehealth: Payer: Self-pay | Admitting: Internal Medicine

## 2012-01-26 NOTE — Telephone Encounter (Signed)
Caller: [name]/Patient; Patient Name: Rachel Reynolds; PCP: Duncan Dull; Best Callback Phone Number: [phone] Patient is calling to find out what the doctor had told her last week that she needed to do after finishing with clear liquids.   As seen in EPIC, patient was to have clear liquids for a few days, Lomotil as needed and follow up in one week.   Patient has been doing clear liquids and Lomotil as needed since 01/21/12.  Reports diarrhea has slowed down, not as bad, but wants to know if she can eat.   Advised patient to start on solid foods, specific foods given as suggestions.  Offered to make follow up appointment for 01/28/12, patient refused, reports she has an appointment already scheduled for 02/03/12.

## 2012-02-03 ENCOUNTER — Ambulatory Visit (INDEPENDENT_AMBULATORY_CARE_PROVIDER_SITE_OTHER): Payer: Medicare Other | Admitting: Internal Medicine

## 2012-02-03 ENCOUNTER — Encounter: Payer: Self-pay | Admitting: Internal Medicine

## 2012-02-03 VITALS — BP 126/58 | HR 70 | Temp 98.0°F | Resp 16 | Wt 119.5 lb

## 2012-02-03 DIAGNOSIS — R419 Unspecified symptoms and signs involving cognitive functions and awareness: Secondary | ICD-10-CM

## 2012-02-03 DIAGNOSIS — R1012 Left upper quadrant pain: Secondary | ICD-10-CM

## 2012-02-03 DIAGNOSIS — R197 Diarrhea, unspecified: Secondary | ICD-10-CM

## 2012-02-03 DIAGNOSIS — R4189 Other symptoms and signs involving cognitive functions and awareness: Secondary | ICD-10-CM

## 2012-02-03 NOTE — Progress Notes (Signed)
Patient ID: Rachel Reynolds, female   DOB: [birth year], 78 y.o.   MRN: [id number]  Patient Active Problem List  Diagnosis  . Anemia  . Dizziness - light-headed  . Hypertension diastolic  . History of MRI of brain and brain stem  . Dysesthesia  . Diarrhea  . Cognitive complaints with normal neuropsychological exam  . Left upper quadrant pain    Subjective:  CC:   Chief Complaint  Patient presents with  . Follow-up    3 month    HPI:   Rachel Reynolds a 78 y.o. female who presents  Past Medical History  Diagnosis Date  . Hypertension   . Vaginal delivery     4  . Nonmelanoma skin cancer 11/06    precancerous actinic keratosis s/p liquid nitrogen, Dr. Gwen Pounds  . Cancer   . History of MRI of brain and brain stem Nov 2012    no evidence of macroangiopathy    Past Surgical History  Procedure Date  . Colon surgery   . Colon resection 2008    proximal due to precancerous polyp  . Abdominal hysterectomy 1962    due to hemorrhagic  . Appendectomy 1948  . Bladder suspension          The following portions of the patient's history were reviewed and updated as appropriate: Allergies, current medications, and problem list.    Review of Systems:   A comprehensive ROS was done and positive for decreased memory. LUQ pain  and intermittent loose stools.  The rest was negative.     History   Social History  . Marital Status: Reynolds    Spouse Name: N/A    Number of Children: N/A  . Years of Education: N/A   Occupational History  . Retired     Neurosurgeon, Midwife at [employer]   Social History Main Topics  . Smoking status: Never Smoker   . Smokeless tobacco: Never Used  . Alcohol Use: No  . Drug Use: No  . Sexually Active: Not on file   Other Topics Concern  . Not on file   Social History Narrative  . No narrative on file    Objective:  BP 126/58  Pulse 70  Temp 98 F (36.7 C) (Oral)  Resp 16  Wt 119 lb 8 oz (54.205 kg)  SpO2  95%  General appearance: alert, cooperative and appears stated age Ears: normal TM's and external ear canals both ears Throat: lips, mucosa, and tongue normal; teeth and gums normal Neck: no adenopathy, no carotid bruit, supple, symmetrical, trachea midline and thyroid not enlarged, symmetric, no tenderness/mass/nodules Back: symmetric, no curvature. ROM normal. No CVA tenderness. Lungs: clear to auscultation bilaterally Heart: regular rate and rhythm, S1, S2 normal, no murmur, click, rub or gallop Abdomen: soft, non-tender; bowel sounds normal; no masses,  no organomegaly Pulses: 2+ and symmetric Skin: Skin color, texture, turgor normal. No rashes or lesions Lymph nodes: Cervical, supraclavicular, and axillary nodes normal. mini-mental status exam: 29/30 (missed one object in recall at 2 minutes    Assessment and Plan:  Cognitive complaints with normal neuropsychological exam Her Mini-Mental Status exam score today was 29 of 30. Clock writing is fine. She has had prior strokes by MRI of the brain done in November 2012. Reassurance provided.  Diarrhea Acute on chronic. Now resolved. Tolerating normal diet.  Left upper quadrant pain She has no masses on exam but is tender to moderate palpation in the left upper quadrant. Abdominal ultrasound ordered.  Updated Medication List Outpatient Encounter Prescriptions as of 02/03/2012  Medication Sig Dispense Refill  . acetaminophen (TYLENOL) 325 MG tablet Take 650 mg by mouth every 6 (six) hours as needed.      Marland Kitchen aspirin 81 MG tablet Take 81 mg by mouth daily.        . calcium carbonate (OS-CAL) 600 MG TABS Take 600 mg by mouth 2 (two) times daily with a meal.        . enalapril (VASOTEC) 5 MG tablet Take 1 tablet (5 mg total) by mouth daily.  30 tablet  11  . omeprazole (PRILOSEC) 20 MG capsule Take 20 mg by mouth daily.        Marland Kitchen triamterene-hydrochlorothiazide (MAXZIDE-25) 37.5-25 MG per tablet Take 0.5 each (0.5 tablets total) by mouth  daily.  30 tablet  11  . vitamin B-12 (CYANOCOBALAMIN) 100 MCG tablet Take 50 mcg by mouth daily.         Orders Placed This Encounter  Procedures  . US Abdomen Complete    No Follow-up on file.

## 2012-02-03 NOTE — Patient Instructions (Signed)
We are ordering an ultrasound of your abdomen.    Return in one month for your annual physical.

## 2012-02-04 ENCOUNTER — Encounter: Payer: Self-pay | Admitting: Internal Medicine

## 2012-02-04 DIAGNOSIS — R419 Unspecified symptoms and signs involving cognitive functions and awareness: Secondary | ICD-10-CM | POA: Insufficient documentation

## 2012-02-04 DIAGNOSIS — R1012 Left upper quadrant pain: Secondary | ICD-10-CM | POA: Insufficient documentation

## 2012-02-04 NOTE — Assessment & Plan Note (Addendum)
Her Mini-Mental Status exam score today was 29 of 30. Clock writing is fine. She has had prior strokes by MRI of the brain done in November 2012. Reassurance provided.

## 2012-02-04 NOTE — Assessment & Plan Note (Signed)
Acute on chronic. Now resolved. Tolerating normal diet.

## 2012-02-04 NOTE — Assessment & Plan Note (Signed)
She has no masses on exam but is tender to moderate palpation in the left upper quadrant. Abdominal ultrasound ordered.

## 2012-02-05 ENCOUNTER — Ambulatory Visit: Payer: Self-pay | Admitting: Internal Medicine

## 2012-02-07 LAB — CBC
HCT: 37.4 % (ref 35.0–47.0)
HGB: 12.9 g/dL (ref 12.0–16.0)
MCH: 32 pg (ref 26.0–34.0)
MCHC: 34.5 g/dL (ref 32.0–36.0)
MCV: 93 fL (ref 80–100)
Platelet: 152 10*3/uL (ref 150–440)
RDW: 13.2 % (ref 11.5–14.5)

## 2012-02-07 LAB — COMPREHENSIVE METABOLIC PANEL
Albumin: 4.6 g/dL (ref 3.4–5.0)
Anion Gap: 10 (ref 7–16)
Bilirubin,Total: 0.5 mg/dL (ref 0.2–1.0)
Calcium, Total: 9.2 mg/dL (ref 8.5–10.1)
Co2: 30 mmol/L (ref 21–32)
EGFR (African American): 51 — ABNORMAL LOW
Glucose: 94 mg/dL (ref 65–99)
Potassium: 3.2 mmol/L — ABNORMAL LOW (ref 3.5–5.1)
SGOT(AST): 29 U/L (ref 15–37)
SGPT (ALT): 17 U/L (ref 12–78)

## 2012-02-07 LAB — TROPONIN I: Troponin-I: <0.02 ng/mL

## 2012-02-07 LAB — CK TOTAL AND CKMB (NOT AT ARMC): CK, Total: 116 U/L (ref 21–215)

## 2012-02-08 ENCOUNTER — Observation Stay: Payer: Self-pay | Admitting: Cardiovascular Disease

## 2012-02-08 DIAGNOSIS — R079 Chest pain, unspecified: Secondary | ICD-10-CM

## 2012-02-08 LAB — TROPONIN I: Troponin-I: <0.02 ng/mL

## 2012-02-09 ENCOUNTER — Telehealth: Payer: Self-pay | Admitting: Internal Medicine

## 2012-02-09 LAB — CLOSTRIDIUM DIFFICILE BY PCR

## 2012-02-09 LAB — BASIC METABOLIC PANEL
Anion Gap: 10 (ref 7–16)
Chloride: 101 mmol/L (ref 98–107)
Co2: 29 mmol/L (ref 21–32)
Creatinine: 1.18 mg/dL (ref 0.60–1.30)
EGFR (Non-African Amer.): 43 — ABNORMAL LOW
Osmolality: 281 (ref 275–301)
Potassium: 3.7 mmol/L (ref 3.5–5.1)
Sodium: 140 mmol/L (ref 136–145)

## 2012-02-09 LAB — MAGNESIUM: Magnesium: 1.4 mg/dL — ABNORMAL LOW

## 2012-02-09 NOTE — Telephone Encounter (Signed)
[facility] hospital discharge 02/09/12 appointment 8/30

## 2012-02-10 NOTE — Telephone Encounter (Signed)
Spoke with patient. She stated that she is doing okay, reminded her of her appt.

## 2012-02-12 ENCOUNTER — Telehealth: Payer: Self-pay | Admitting: Internal Medicine

## 2012-02-12 NOTE — Telephone Encounter (Signed)
plesae be sure she has a hospital followup,

## 2012-02-12 NOTE — Telephone Encounter (Signed)
Patient does have an appt and we have already spoke with her.

## 2012-02-13 ENCOUNTER — Encounter: Payer: Self-pay | Admitting: Internal Medicine

## 2012-02-13 ENCOUNTER — Ambulatory Visit (INDEPENDENT_AMBULATORY_CARE_PROVIDER_SITE_OTHER): Payer: Medicare Other | Admitting: Internal Medicine

## 2012-02-13 VITALS — BP 144/60 | HR 56 | Temp 98.1°F | Resp 12 | Wt 119.5 lb

## 2012-02-13 DIAGNOSIS — N63 Unspecified lump in unspecified breast: Secondary | ICD-10-CM

## 2012-02-13 DIAGNOSIS — N6310 Unspecified lump in the right breast, unspecified quadrant: Secondary | ICD-10-CM

## 2012-02-13 DIAGNOSIS — R197 Diarrhea, unspecified: Secondary | ICD-10-CM

## 2012-02-13 DIAGNOSIS — I10 Essential (primary) hypertension: Secondary | ICD-10-CM

## 2012-02-13 NOTE — Patient Instructions (Addendum)
I AM setting you up for a diagnostic mammogram at the [facility] (across the street from the hospital)    We will worry about your mild  kidney problem after your other issues are sorted out.   Please return in two weeks for a repeat magnesium and potassium check   We will Dr Nigel Bridgeman office to add EGD to your colonoscopy because of  your dysphagia

## 2012-02-13 NOTE — Progress Notes (Signed)
Patient ID: Rachel Reynolds, female   DOB: 1927-02-16, 78 y.o.   MRN: [id number]  Patient Active Problem List  Diagnosis  . Anemia  . Dizziness - light-headed  . Hypertension diastolic  . History of MRI of brain and brain stem  . Dysesthesia  . Diarrhea  . Cognitive complaints with normal neuropsychological exam  . Left upper quadrant pain  . Breast mass in female  . Kidney disease, chronic, stage III (GFR 30-59 ml/min)    Subjective:  CC:   Chief Complaint  Patient presents with  . Follow-up    hospital    HPI:   Rachel Reynolds a 78 y.o. female who presents for hospital followup for brief admission for chest pain, with negative enzymes and no changes on EKG. She was discharged on August 26th.  Multiple issues were addressed.  She had a CT of abd/pelvis which was unremarkable, and was ordered to investigate  her report of chronic diarrhea and LUQ pain. However a mass in the right breast was suggested and the hospitalist confirmed with breast exam. The diarrhea was investigated with stool studies which were negative.  She was hypokalemic and hypomagnesemic. Electrolytes were replaced and maxzide was stopped.  CKD Stagr III was diagnosed based on cr of 1.15, and she is concerned about this diagnosis given her daughter, Rachel Reynolds troubles with kidney failure.   She is accompanied by her daughter today.  Her abdominal pain has resolved. She is not had any recurrent episodes of chest pain. She is scheduled to see Dr. Samuella Cota for outpatient evaluation.   Past Medical History  Diagnosis Date  . Hypertension   . Vaginal delivery     4  . Nonmelanoma skin cancer 11/06    precancerous actinic keratosis s/p liquid nitrogen, Dr. Gwen Pounds  . Cancer   . History of MRI of brain and brain stem Nov 2012    no evidence of macroangiopathy    Past Surgical History  Procedure Date  . Colon surgery   . Colon resection 2008    proximal due to precancerous polyp  . Abdominal  hysterectomy 1962    due to hemorrhagic  . Appendectomy 1948  . Bladder suspension          The following portions of the patient's history were reviewed and updated as appropriate: Allergies, current medications, and problem list.    Review of Systems:   12 Pt  review of systems was negative except those addressed in the HPI,     History   Social History  . Marital Status: Single    Spouse Name: N/A    Number of Children: N/A  . Years of Education: N/A   Occupational History  . Retired     Neurosurgeon, Midwife at [employer]   Social History Main Topics  . Smoking status: Never Smoker   . Smokeless tobacco: Never Used  . Alcohol Use: No  . Drug Use: No  . Sexually Active: Not on file   Other Topics Concern  . Not on file   Social History Narrative  . No narrative on file    Objective:  BP 144/60  Pulse 56  Temp 98.1 F (36.7 C) (Oral)  Resp 12  Wt 119 lb 8 oz (54.205 kg)  SpO2 96%  General appearance: alert, cooperative and appears stated age Ears: normal TM's and external ear canals both ears Throat: lips, mucosa, and tongue normal; teeth and gums normal Neck: no adenopathy, no carotid bruit, supple,  symmetrical, trachea midline and thyroid not enlarged, symmetric, no tenderness/mass/nodules Back: symmetric, no curvature. ROM normal. No CVA tenderness. Lungs: clear to auscultation bilaterally Heart: regular rate and rhythm, S1, S2 normal, no murmur, click, rub or gallop Abdomen: soft, non-tender; bowel sounds normal; no masses,  no organomegaly Pulses: 2+ and symmetric Skin: Skin color, texture, turgor normal. No rashes or lesions Lymph nodes: Cervical, supraclavicular, and axillary nodes normal.  Assessment and Plan:  Breast mass in female She has a palpable 1 cm mass in the upper outer quadrant of the right breast that was first seen on CT done during recent admission fro chest pain.  Diagnostic mammogram has been set up for Sept 9th at  [facility].  Diarrhea Chronic, since prior colon surgery,  With recent negative stool studies. Discussed stopping fresh fruit and prilosec doe two weeks t see if symptoms resolve. GI referral  Underway for diagnostic colonoscopy.   Hypertension diastolic Her bp is elevated today bc maxzide was stopped during recent hospital admission due to electrolyte disturbances .  She will need her dose of vasotec increased if BP remains elevated/   Kidney disease, chronic, stage III (GFR 30-59 ml/min) Diagnosed during recent hospital admission  Cr has been relatively  Stable for the past year, at times aggravated  By use of diuretic.  Will begin workup after breast mass is worked up with SPEP and urine IFE   Updated Medication List Outpatient Encounter Prescriptions as of 02/13/2012  Medication Sig Dispense Refill  . acetaminophen (TYLENOL) 325 MG tablet Take 650 mg by mouth every 6 (six) hours as needed.      Marland Kitchen aspirin 81 MG tablet Take 81 mg by mouth daily.        . calcium carbonate (OS-CAL) 600 MG TABS Take 600 mg by mouth 2 (two) times daily with a meal.        . enalapril (VASOTEC) 5 MG tablet Take 1 tablet (5 mg total) by mouth daily.  30 tablet  11  . magnesium oxide (MAG-OX) 400 MG tablet Take 400 mg by mouth 2 (two) times daily.      . metoprolol tartrate (LOPRESSOR) 25 MG tablet Take 25 mg by mouth 2 (two) times daily.      Marland Kitchen omeprazole (PRILOSEC) 20 MG capsule Take 20 mg by mouth daily.        . potassium chloride (K-DUR) 10 MEQ tablet Take 10 mEq by mouth daily.      . vitamin B-12 (CYANOCOBALAMIN) 100 MCG tablet Take 50 mcg by mouth daily.      Marland Kitchen DISCONTD: triamterene-hydrochlorothiazide (MAXZIDE-25) 37.5-25 MG per tablet Take 0.5 each (0.5 tablets total) by mouth daily.  30 tablet  11     Orders Placed This Encounter  Procedures  . MM Digital Diagnostic Bilat    Return in about 2 weeks (around 02/27/2012).

## 2012-02-14 ENCOUNTER — Encounter: Payer: Self-pay | Admitting: Internal Medicine

## 2012-02-14 DIAGNOSIS — C50919 Malignant neoplasm of unspecified site of unspecified female breast: Secondary | ICD-10-CM | POA: Insufficient documentation

## 2012-02-14 NOTE — Assessment & Plan Note (Addendum)
Her bp is elevated today bc maxzide was stopped during recent hospital admission due to electrolyte disturbances .  She will need her dose of vasotec increased if BP remains elevated/

## 2012-02-14 NOTE — Assessment & Plan Note (Signed)
Chronic, since prior colon surgery,  With recent negative stool studies. Discussed stopping fresh fruit and prilosec doe two weeks t see if symptoms resolve. GI referral  Underway for diagnostic colonoscopy.

## 2012-02-14 NOTE — Assessment & Plan Note (Signed)
She has a palpable 1 cm mass in the upper outer quadrant of the right breast that was first seen on CT done during recent admission fro chest pain.  Diagnostic mammogram has been set up for Sept 9th at [facility].

## 2012-02-14 NOTE — Assessment & Plan Note (Signed)
Diagnosed during recent hospital admission  Cr has been relatively  Stable for the past year, at times aggravated  By use of diuretic.  Will begin workup after breast mass is worked up with SPEP and urine IFE

## 2012-02-17 ENCOUNTER — Encounter: Payer: Self-pay | Admitting: Cardiovascular Disease

## 2012-02-17 ENCOUNTER — Ambulatory Visit (INDEPENDENT_AMBULATORY_CARE_PROVIDER_SITE_OTHER): Payer: Medicare Other | Admitting: Cardiovascular Disease

## 2012-02-17 VITALS — BP 132/72 | HR 53 | Ht 62.0 in | Wt 119.5 lb

## 2012-02-17 DIAGNOSIS — R0789 Other chest pain: Secondary | ICD-10-CM

## 2012-02-17 DIAGNOSIS — I10 Essential (primary) hypertension: Secondary | ICD-10-CM

## 2012-02-17 NOTE — Patient Instructions (Addendum)
Follow up as needed

## 2012-02-17 NOTE — Progress Notes (Signed)
HPI  This is an 78 year old female who is here today for a followup visit after recent hospitalization at [facility]. She presented with atypical chest pain with chronic abdominal pain and diarrhea. Her cardiac enzymes were negative an ECG showed no acute changes. Her symptoms were felt to be very atypical. She underwent a pharmacologic nuclear stress test which showed overall no evidence of ischemia or infarcts on perfusion analysis. However, she had significant ST depression with Lexiscan which raised the possibility of balanced three-vessel ischemia. Her ejection fraction was normal and there was no transient ischemic dilatation. Given that her symptoms overall were not convincing for angina I recommended medical therapy. She was discharged home on aspirin 81 mg daily and small dose metoprolol. The patient at this point denies any chest discomfort or pain. She denies dyspnea.    Allergies  Allergen Reactions  . [employer] (Tretinoin)   . Lipitor (Atorvastatin Calcium)   . Meloxicam   . Morphine And Related   . Sulfa Drugs Cross Reactors      Current Outpatient Prescriptions on File Prior to Visit  Medication Sig Dispense Refill  . acetaminophen (TYLENOL) 325 MG tablet Take 650 mg by mouth every 6 (six) hours as needed.      Marland Kitchen aspirin 81 MG tablet Take 81 mg by mouth daily.        . calcium carbonate (OS-CAL) 600 MG TABS Take 600 mg by mouth 2 (two) times daily with a meal.        . enalapril (VASOTEC) 5 MG tablet Take 1 tablet (5 mg total) by mouth daily.  30 tablet  11  . magnesium oxide (MAG-OX) 400 MG tablet Take 400 mg by mouth 2 (two) times daily.      . metoprolol tartrate (LOPRESSOR) 25 MG tablet Take 25 mg by mouth 2 (two) times daily.      Marland Kitchen omeprazole (PRILOSEC) 20 MG capsule Take 20 mg by mouth daily.        . potassium chloride (K-DUR) 10 MEQ tablet Take 10 mEq by mouth daily.      . vitamin B-12 (CYANOCOBALAMIN) 100 MCG tablet Take 50 mcg by mouth daily.         Past Medical  History  Diagnosis Date  . Hypertension   . Vaginal delivery     4  . Nonmelanoma skin cancer 11/06    precancerous actinic keratosis s/p liquid nitrogen, Dr. Gwen Pounds  . Cancer   . History of MRI of brain and brain stem Nov 2012    no evidence of macroangiopathy  . GERD (gastroesophageal reflux disease)   . Glaucoma   . Chronic kidney disease     chronic kidney disease, stage II     Past Surgical History  Procedure Date  . Colon surgery   . Colon resection 2008    proximal due to precancerous polyp  . Abdominal hysterectomy 1962    due to hemorrhagic  . Appendectomy 1948  . Bladder suspension      Family History  Problem Relation Age of Onset  . Coronary artery disease Mother   . Cancer Sister     colon  . Coronary artery disease Brother      History   Social History  . Marital Status: Single    Spouse Name: N/A    Number of Children: N/A  . Years of Education: N/A   Occupational History  . Retired     Neurosurgeon, Midwife at [employer]  Social History Main Topics  . Smoking status: Never Smoker   . Smokeless tobacco: Never Used  . Alcohol Use: No  . Drug Use: No  . Sexually Active: Not on file   Other Topics Concern  . Not on file   Social History Narrative  . No narrative on file     PHYSICAL EXAM   BP 132/72  Pulse 53  Ht 5\' 2"  (1.575 m)  Wt 119 lb 8 oz (54.205 kg)  BMI 21.86 kg/m2  Constitutional: She is oriented to person, place, and time. She appears well-developed and well-nourished. No distress.  HENT: No nasal discharge.  Head: Normocephalic and atraumatic.  Eyes: Pupils are equal and round. Right eye exhibits no discharge. Left eye exhibits no discharge.  Neck: Normal range of motion. Neck supple. No JVD present. No thyromegaly present.  Cardiovascular: Normal rate, regular rhythm, normal heart sounds. Exam reveals no gallop and no friction rub. No murmur heard.  Pulmonary/Chest: Effort normal and breath sounds normal. No  stridor. No respiratory distress. She has no wheezes. She has no rales. She exhibits no tenderness.  Abdominal: Soft. Bowel sounds are normal. She exhibits no distension. There is no tenderness. There is no rebound and no guarding.  Musculoskeletal: Normal range of motion. She exhibits no edema and no tenderness.  Neurological: She is alert and oriented to person, place, and time. Coordination normal.  Skin: Skin is warm and dry. No rash noted. She is not diaphoretic. No erythema. No pallor.  Psychiatric: She has a normal mood and affect. Her behavior is normal. Judgment and thought content normal.    EKG: Sinus  Bradycardia  -Poor R-wave progression -may be secondary to pulmonary disease   consider old anterior infarct.   Low voltage -possible pulmonary disease.   ABNORMAL    ASSESSMENT AND PLAN

## 2012-02-17 NOTE — Assessment & Plan Note (Signed)
She has not had any chest pain since hospital discharge. No further cardiac workup is recommended at this time. She is slightly bradycardic on metoprolol but she seems to be asymptomatic. This can be decreased to 12.5 mg twice daily at some point or discontinued altogether if her heart rate remains low. I'm not planning further cardiac workup at this time unless she develops recurrent symptoms. She is to followup with me as needed.

## 2012-02-23 ENCOUNTER — Telehealth: Payer: Self-pay | Admitting: Internal Medicine

## 2012-02-23 ENCOUNTER — Telehealth: Payer: Self-pay

## 2012-02-23 DIAGNOSIS — N6310 Unspecified lump in the right breast, unspecified quadrant: Secondary | ICD-10-CM

## 2012-02-23 NOTE — Telephone Encounter (Signed)
Patient needs a cardiac clearance for a colonoscopy and an EGD which is scheduled for Oct. 30, 2013 with Dr. Bluford Kaufmann at [facility]. Please advise if okay.

## 2012-02-23 NOTE — Telephone Encounter (Signed)
Patient and her son stopped by to change her appointment with [place] for this Friday.  Patient states Dr. Marylyn Ishihara was going to call our office, [place] and will back on Sunday.  I advised her and son that we would call her once this had been set up.

## 2012-02-23 NOTE — Telephone Encounter (Signed)
No preference

## 2012-02-23 NOTE — Telephone Encounter (Signed)
Is the order for the biopsy? I have printed out an order for biopsy. Does [facility] do breast biopsies?

## 2012-02-23 NOTE — Telephone Encounter (Signed)
According to your last note, she is cleared. Is this correct?

## 2012-02-23 NOTE — Telephone Encounter (Signed)
Yes, they only need the order for the biopsy.  The physician stated if you have a preference on a surgeon that was fine, but they would be willing to take care of that.

## 2012-02-23 NOTE — Telephone Encounter (Signed)
A physician called from [facility] called and stated there is a breast mass found in her right breast that needs to be biopsied.  She just needs an order sent to [facility].  Please advise.

## 2012-02-24 ENCOUNTER — Telehealth: Payer: Self-pay | Admitting: Internal Medicine

## 2012-02-24 ENCOUNTER — Encounter: Payer: Self-pay | Admitting: Internal Medicine

## 2012-02-24 NOTE — Telephone Encounter (Signed)
Cardiac clearance letter faxed to [facility] with Dr. Bluford Kaufmann for her upcoming procedure. Faxed letter to [phone].

## 2012-02-24 NOTE — Telephone Encounter (Signed)
Yes. She is cleared. Low risk.

## 2012-02-24 NOTE — Telephone Encounter (Signed)
Patient has to be scheduled at [facility] for her Breast biopsy [phone].

## 2012-02-24 NOTE — Telephone Encounter (Signed)
See below

## 2012-02-26 NOTE — Telephone Encounter (Signed)
I sent the order to [facility]

## 2012-02-27 ENCOUNTER — Ambulatory Visit: Payer: Medicare Other | Admitting: Internal Medicine

## 2012-03-04 ENCOUNTER — Encounter: Payer: Self-pay | Admitting: Internal Medicine

## 2012-03-10 ENCOUNTER — Encounter: Payer: Self-pay | Admitting: Internal Medicine

## 2012-03-10 ENCOUNTER — Ambulatory Visit (INDEPENDENT_AMBULATORY_CARE_PROVIDER_SITE_OTHER): Payer: Medicare Other | Admitting: Internal Medicine

## 2012-03-10 VITALS — BP 146/62 | HR 54 | Temp 97.7°F | Resp 14 | Ht 60.5 in | Wt 116.5 lb

## 2012-03-10 DIAGNOSIS — N63 Unspecified lump in unspecified breast: Secondary | ICD-10-CM

## 2012-03-10 DIAGNOSIS — Z23 Encounter for immunization: Secondary | ICD-10-CM

## 2012-03-10 DIAGNOSIS — R197 Diarrhea, unspecified: Secondary | ICD-10-CM

## 2012-03-10 NOTE — Assessment & Plan Note (Signed)
Spent 30 minutes discussing the recent finding a breast mass on the right with both patient and her son [name] I have strongly advised that she'll he see a surgeon for consideration of biopsy and/or lumpectomy. She remains undecided.
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
## 2012-03-10 NOTE — Addendum Note (Signed)
Addended by: Darletta Moll A on: 03/10/2012 03:24 PM   Modules accepted: Orders

## 2012-03-10 NOTE — Patient Instructions (Addendum)
Please try taking the immodium 30 minutes before each meal.  Tyry this for a few days.  I am giving you samples of Creon , an enzyme that you  be lacking to digest your food.    If your diarrhea is still present after chanign the ommdium to pre meal,   Start the creon  Take one capsule  before each meal as well to see if it stops the diarrhea  I am calling Dr Renda Rolls to get your breast biopsy scheduled as soon as possible.

## 2012-03-10 NOTE — Assessment & Plan Note (Signed)
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 Her last colonoscopy was normal except for diverticulosis in July 2010.  Stool studies have been repeatedly normal and repeated in August during hospitalization. There were again negative. She has lost 6 pounds over the last year. She is scheduled to see Dr.Ooh for colonoscopy at the end of October. She is frustrated that she has to wait that long. I suggested that she try taking her Imodium prior to being followed by a trial of Creon

## 2012-03-11 DIAGNOSIS — N63 Unspecified lump in unspecified breast: Secondary | ICD-10-CM

## 2012-03-11 DIAGNOSIS — I10 Essential (primary) hypertension: Secondary | ICD-10-CM

## 2012-03-11 DIAGNOSIS — R197 Diarrhea, unspecified: Secondary | ICD-10-CM

## 2012-03-12 ENCOUNTER — Telehealth: Payer: Self-pay | Admitting: Internal Medicine

## 2012-03-12 DIAGNOSIS — N63 Unspecified lump in unspecified breast: Secondary | ICD-10-CM

## 2012-03-12 NOTE — Telephone Encounter (Signed)
Patient called and stated she wants to proceed with the breast biopsy and wants Dr. Katrinka Blazing to perform the surgery.  Please advise.

## 2012-03-15 NOTE — Telephone Encounter (Signed)
Referral in EP[facility] for Dr Renda Rolls. Please expedite. Patient has a breast mass and abnormal mammogram

## 2012-03-16 DIAGNOSIS — K222 Esophageal obstruction: Secondary | ICD-10-CM

## 2012-03-16 HISTORY — DX: Esophageal obstruction: K22.2

## 2012-03-16 NOTE — Telephone Encounter (Signed)
Pt would like appointment asap [phone] please call

## 2012-04-14 ENCOUNTER — Ambulatory Visit: Payer: Self-pay | Admitting: Gastroenterology

## 2012-04-15 ENCOUNTER — Telehealth: Payer: Self-pay | Admitting: Internal Medicine

## 2012-04-15 NOTE — Telephone Encounter (Signed)
Patient called to let us know that she had her GI appointments yesterday.  She said that everything went well and that they did stretch her esophagus.

## 2012-04-16 HISTORY — PX: BREAST SURGERY: SHX581

## 2012-04-21 ENCOUNTER — Ambulatory Visit: Payer: Self-pay | Admitting: Surgery

## 2012-04-22 ENCOUNTER — Ambulatory Visit (INDEPENDENT_AMBULATORY_CARE_PROVIDER_SITE_OTHER): Payer: Medicare Other | Admitting: Internal Medicine

## 2012-04-22 ENCOUNTER — Encounter: Payer: Self-pay | Admitting: Internal Medicine

## 2012-04-22 VITALS — BP 140/80 | HR 73 | Temp 98.3°F | Ht 60.0 in | Wt 117.8 lb

## 2012-04-22 DIAGNOSIS — R0789 Other chest pain: Secondary | ICD-10-CM

## 2012-04-22 DIAGNOSIS — K222 Esophageal obstruction: Secondary | ICD-10-CM

## 2012-04-22 DIAGNOSIS — I10 Essential (primary) hypertension: Secondary | ICD-10-CM

## 2012-04-22 DIAGNOSIS — I2510 Atherosclerotic heart disease of native coronary artery without angina pectoris: Secondary | ICD-10-CM

## 2012-04-22 DIAGNOSIS — E559 Vitamin D deficiency, unspecified: Secondary | ICD-10-CM

## 2012-04-22 DIAGNOSIS — N63 Unspecified lump in unspecified breast: Secondary | ICD-10-CM

## 2012-04-22 DIAGNOSIS — R197 Diarrhea, unspecified: Secondary | ICD-10-CM

## 2012-04-22 MED ORDER — METOPROLOL TARTRATE 25 MG PO TABS: 12.5000 mg | ORAL_TABLET | Freq: Two times a day (BID) | ORAL | Status: DC

## 2012-04-22 NOTE — Patient Instructions (Addendum)
Please resume the metoprolol pill at 1/2 tablet two times per day (morning and evening)  This pill protects your heart but also slows heart rate and lower blood pressure   If it makes you more tired or sleepy ,  You can stop it or just take it at night

## 2012-04-23 ENCOUNTER — Encounter: Payer: Self-pay | Admitting: Internal Medicine

## 2012-04-23 DIAGNOSIS — I2510 Atherosclerotic heart disease of native coronary artery without angina pectoris: Secondary | ICD-10-CM | POA: Insufficient documentation

## 2012-04-23 DIAGNOSIS — K222 Esophageal obstruction: Secondary | ICD-10-CM | POA: Insufficient documentation

## 2012-04-23 NOTE — Assessment & Plan Note (Signed)
Status post core biopsy yesterday by Dr. Excell Seltzer. She will follow up with Dr. Derrill Kay pending path results.

## 2012-04-23 NOTE — Assessment & Plan Note (Signed)
Functional, colonoscopy was completely normal except for diverticulosis. Diarrhea has notably resolved since her colonoscopy.

## 2012-04-23 NOTE — Progress Notes (Signed)
Patient ID: Rachel Reynolds, female   DOB: Nov 20, 1926, 78 y.o.   MRN: [id number]  Patient Active Problem List  Diagnosis  . Anemia  . Dizziness - light-headed  . Hypertension  . History of MRI of brain and brain stem  . Dysesthesia  . Diarrhea  . Cognitive complaints with normal neuropsychological exam  . Left upper quadrant pain  . Breast mass in female  . Kidney disease, chronic, stage III (GFR 30-59 ml/min)  . Atypical chest pain  . Benign esophageal stricture    Subjective:  CC:   Chief Complaint  Patient presents with  . Follow-up    HPI:   Rachel Reynolds a 78 y.o. female who presents for followup on multiple issues including right breast mass, hypertension, CAD and diarrhea. She underwent a core needle biopsy yesterday by Dr. Excell Seltzer and after discussing the procedure with Dr. Katrinka Blazing. 7 samples were taken. Results of biopsy are still pending. She has a little pain at the biopsy site but no bleeding.  She continues to be ambivalent about treatment and she does not think she could handle the diagnosis or the treatment of breast cancer. She had an esophageal stricture found on endoscopy done last week by Dr. Bluford Kaufmann. This was dilated successfully. It was benign appearing and path report is still pending. She states that she was having trouble with dry... such as white meat chicken prior to the dilation. She is now swallowing no problems. She does note some hoarseness of her voice if by the end of the day. She has no pain with swallowing. She also underwent colonoscopy at the same time for evaluation of persistent diarrhea. Colonoscopy showed only multiple small and large a large mouth diverticula in the sigmoid colon but was otherwise normal. She states that her diarrhea has resolved since she went to the colonoscopy. She is now taking MiraLAX on a daily basis currently. Her son Joni Fears is with her and would like to have her medication list reviewed and minimized if possible since  patient takes her own medications.    Past Medical History  Diagnosis Date  . Hypertension   . Vaginal delivery     4  . Nonmelanoma skin cancer 11/06    precancerous actinic keratosis s/p liquid nitrogen, Dr. Gwen Pounds  . Cancer   . History of MRI of brain and brain stem Nov 2012    no evidence of macroangiopathy  . GERD (gastroesophageal reflux disease)   . Glaucoma   . Chronic kidney disease     chronic kidney disease, stage II  . Benign esophageal stricture Oct 2013    dilated by Dr Bluford Kaufmann     Past Surgical History  Procedure Date  . Colon surgery   . Colon resection 2008    proximal due to precancerous polyp  . Abdominal hysterectomy 1962    due to hemorrhagic  . Appendectomy 1948  . Bladder suspension   . Breast surgery nov 2013    core biopsy Dr Excell Seltzer         The following portions of the patient's history were reviewed and updated as appropriate: Allergies, current medications, and problem list.    Review of Systems:   12 Pt  review of systems was negative except those addressed in the HPI,     History   Social History  . Marital Status: Single    Spouse Name: N/A    Number of Children: N/A  . Years of Education: N/A   Occupational  History  . Retired     Neurosurgeon, Midwife at [employer]   Social History Main Topics  . Smoking status: Never Smoker   . Smokeless tobacco: Never Used  . Alcohol Use: No  . Drug Use: No  . Sexually Active: Not on file   Other Topics Concern  . Not on file   Social History Narrative  . No narrative on file    Objective:  BP 140/80  Pulse 73  Temp 98.3 F (36.8 C) (Oral)  Ht 5' (1.524 m)  Wt 117 lb 12 oz (53.411 kg)  BMI 23.00 kg/m2  SpO2 97%  General appearance: alert, cooperative and appears stated age Ears: normal TM's and external ear canals both ears Throat: lips, mucosa, and tongue normal; teeth and gums normal Neck: no adenopathy, no carotid bruit, supple, symmetrical, trachea midline  and thyroid not enlarged, symmetric, no tenderness/mass/nodules Back: symmetric, no curvature. ROM normal. No CVA tenderness. Lungs: clear to auscultation bilaterally Breast: Right breast biopsy site is bruised. No drainage or active bleeding. Heart: regular rate and rhythm, S1, S2 normal, no murmur, click, rub or gallop Abdomen: soft, non-tender; bowel sounds normal; no masses,  no organomegaly Pulses: 2+ and symmetric Skin: Skin color, texture, turgor normal. No rashes or lesions Lymph nodes: Cervical, supraclavicular, and axillary nodes normal.  Assessment and Plan:  Benign esophageal stricture Status post successful dilation by Dr. Bluford Kaufmann on October 30 during EGD. Continue proton pump inhibitor.  Atypical chest pain She was admitted to [facility] in September with dynamic EKG changes and an abnormal Myoview. Cardiac catheterization was not done due to her age. She was started on the Toprol at that time by cardiology. She has not been taking it since her prescription ran out. I recommended that she resume the metoprolol at 12.5 mg twice daily for risk modification. She will return for fasting lipids at her leisure.  Breast mass in female Status post core biopsy yesterday by Dr. Excell Seltzer. She will follow up with Dr. Derrill Kay pending path results.  Diarrhea Functional, colonoscopy was completely normal except for diverticulosis. Diarrhea has notably resolved since her colonoscopy.  Hypertension Her blood pressure was quite elevated after the procedure yesterday and Dr. Excell Seltzer called me with concerns about releasing her. She is having no symptoms at the time. He gave her an extra dose of enalapril 5 mg and blood pressure had improved prior to discharge from the radiology suite. Blood pressure today is normal. Continue current medications and resume metoprolol 12.5 mg as previously discussed  Coronary artery disease Again suggested by recent hospital admission for chest pain with dynamic EKG changes  and an abnormal Myoview. Continue enalapril, resume metoprolol, return for fasting lipids.   Updated Medication List Outpatient Encounter Prescriptions as of 04/22/2012  Medication Sig Dispense Refill  . acetaminophen (TYLENOL) 325 MG tablet Take 650 mg by mouth every 6 (six) hours as needed.      Marland Kitchen aspirin 81 MG tablet Take 81 mg by mouth daily.        . calcium carbonate (OS-CAL) 600 MG TABS Take 600 mg by mouth 2 (two) times daily with a meal.        . diphenoxylate-atropine (LOMOTIL) 2.5-0.025 MG per tablet       . enalapril (VASOTEC) 5 MG tablet Take 1 tablet (5 mg total) by mouth daily.  30 tablet  11  . latanoprost (XALATAN) 0.005 % ophthalmic solution Place 1 drop into the left eye at bedtime.      Marland Kitchen  magnesium oxide (MAG-OX) 400 MG tablet Take 400 mg by mouth 2 (two) times daily.      . metoprolol tartrate (LOPRESSOR) 25 MG tablet Take 25 mg by mouth 2 (two) times daily.      Marland Kitchen omeprazole (PRILOSEC) 20 MG capsule Take 20 mg by mouth daily.        . polyethylene glycol powder (GLYCOLAX/MIRALAX) powder       . potassium chloride (K-DUR) 10 MEQ tablet Take 10 mEq by mouth daily.      Marland Kitchen triamterene-hydrochlorothiazide (MAXZIDE-25) 37.5-25 MG per tablet       . vitamin B-12 (CYANOCOBALAMIN) 100 MCG tablet Take 50 mcg by mouth daily.      . metoprolol tartrate (LOPRESSOR) 25 MG tablet Take 0.5 tablets (12.5 mg total) by mouth 2 (two) times daily.  90 tablet  3

## 2012-04-23 NOTE — Assessment & Plan Note (Signed)
Status post successful dilation by Dr. Bluford Kaufmann on October 30 during EGD. Continue proton pump inhibitor.

## 2012-04-23 NOTE — Assessment & Plan Note (Signed)
Her blood pressure was quite elevated after the procedure yesterday and Dr. Excell Seltzer called me with concerns about releasing her. She is having no symptoms at the time. He gave her an extra dose of enalapril 5 mg and blood pressure had improved prior to discharge from the radiology suite. Blood pressure today is normal. Continue current medications and resume metoprolol 12.5 mg as previously discussed

## 2012-04-23 NOTE — Assessment & Plan Note (Signed)
She was admitted to [facility] in September with dynamic EKG changes and an abnormal Myoview. Cardiac catheterization was not done due to her age. She was started on the Toprol at that time by cardiology. She has not been taking it since her prescription ran out. I recommended that she resume the metoprolol at 12.5 mg twice daily for risk modification. She will return for fasting lipids at her leisure.

## 2012-04-23 NOTE — Assessment & Plan Note (Signed)
Again suggested by recent hospital admission for chest pain with dynamic EKG changes and an abnormal Myoview. Continue enalapril, resume metoprolol, return for fasting lipids.

## 2012-04-28 ENCOUNTER — Ambulatory Visit: Payer: Self-pay | Admitting: Surgery

## 2012-04-28 LAB — POTASSIUM: Potassium: 3.9 mmol/L (ref 3.5–5.1)

## 2012-04-28 LAB — MAGNESIUM: Magnesium: 1.8 mg/dL

## 2012-04-29 LAB — PATHOLOGY REPORT

## 2012-05-06 ENCOUNTER — Ambulatory Visit: Payer: Self-pay | Admitting: Surgery

## 2012-05-07 ENCOUNTER — Encounter: Payer: Self-pay | Admitting: Internal Medicine

## 2012-05-24 ENCOUNTER — Ambulatory Visit: Payer: Self-pay | Admitting: Internal Medicine

## 2012-05-24 ENCOUNTER — Ambulatory Visit: Payer: Self-pay | Admitting: Oncology

## 2012-05-25 LAB — PATHOLOGY REPORT

## 2012-06-16 ENCOUNTER — Ambulatory Visit: Payer: Self-pay | Admitting: Oncology

## 2012-06-16 ENCOUNTER — Ambulatory Visit: Payer: Self-pay | Admitting: Internal Medicine

## 2012-06-22 ENCOUNTER — Other Ambulatory Visit: Payer: Self-pay | Admitting: Internal Medicine

## 2012-06-22 NOTE — Telephone Encounter (Signed)
Refill on Lomotil 2.5

## 2012-06-23 ENCOUNTER — Other Ambulatory Visit: Payer: Self-pay | Admitting: Internal Medicine

## 2012-06-23 MED ORDER — DIPHENOXYLATE-ATROPINE 2.5-0.025 MG PO TABS: 1.0000 | ORAL_TABLET | ORAL | Status: DC | PRN

## 2012-06-23 NOTE — Telephone Encounter (Signed)
Filled

## 2012-06-23 NOTE — Telephone Encounter (Signed)
Med filled.  

## 2012-06-23 NOTE — Telephone Encounter (Signed)
diphenoxylate-atropine (LOMOTIL) 2.5-0.025 MG per tablet   #30

## 2012-07-26 ENCOUNTER — Ambulatory Visit: Payer: Medicare Other | Admitting: Internal Medicine

## 2012-07-29 ENCOUNTER — Ambulatory Visit: Payer: Medicare Other | Admitting: Internal Medicine

## 2012-08-05 ENCOUNTER — Ambulatory Visit (INDEPENDENT_AMBULATORY_CARE_PROVIDER_SITE_OTHER): Payer: Medicare Other | Admitting: Internal Medicine

## 2012-08-05 ENCOUNTER — Encounter: Payer: Self-pay | Admitting: Internal Medicine

## 2012-08-05 VITALS — BP 134/60 | HR 63 | Temp 97.5°F | Resp 16 | Wt 119.5 lb

## 2012-08-05 DIAGNOSIS — N63 Unspecified lump in unspecified breast: Secondary | ICD-10-CM

## 2012-08-05 MED ORDER — TRIAMTERENE-HCTZ 37.5-25 MG PO TABS: 1.0000 | ORAL_TABLET | Freq: Every day | ORAL | Status: DC

## 2012-08-05 NOTE — Patient Instructions (Addendum)
We are going to resume your triamterene hctz for your leg swelling.  I will send it to [facility] (90 days )   If it makes your blood pressure drop below 110,  Let me know   Return for blood work in one week to check your potassium and sodium levels.

## 2012-08-05 NOTE — Assessment & Plan Note (Addendum)
S/p breast resection in November of a 1..4 cm lesion with sentinel LN biopsy. by Renda Rolls.,  Has follow up with Dr. Katrinka Blazing in March .  She is not interested in XRT or adjuvant therapy.

## 2012-08-05 NOTE — Progress Notes (Signed)
Patient ID: Rachel Reynolds, female   DOB: May 01, 1927, 79 y.o.   MRN: [id number]   Patient Active Problem List  Diagnosis  . Anemia  . Dizziness - light-headed  . Hypertension  . History of MRI of brain and brain stem  . Dysesthesia  . Diarrhea  . Cognitive complaints with normal neuropsychological exam  . Left upper quadrant pain  . Invasive lobular carcinoma of breast, stage 2  . Kidney disease, chronic, stage III (GFR 30-59 ml/min)  . Atypical chest pain  . Benign esophageal stricture  . Coronary artery disease  . Edema    Subjective:  CC:   Chief Complaint  Patient presents with  . Follow-up    HPI:   [name] a 79 y.o. female who presents for follow up on chronic conditions, including recent diagnosis of breast cancer with resection by Renda Rolls  Patient has deferred additional treatment.  She feels generally well with the exception of the development of pedal edema for the past 2 weeks.     Past Medical History  Diagnosis Date  . Hypertension   . Vaginal delivery     4  . Nonmelanoma skin cancer 11/06    precancerous actinic keratosis s/p liquid nitrogen, Dr. Gwen Pounds  . Cancer   . History of MRI of brain and brain stem Nov 2012    no evidence of macroangiopathy  . GERD (gastroesophageal reflux disease)   . Glaucoma   . Chronic kidney disease     chronic kidney disease, stage II  . Benign esophageal stricture Oct 2013    dilated by Dr Bluford Kaufmann     Past Surgical History  Procedure Laterality Date  . Colon surgery    . Colon resection  2008    proximal due to precancerous polyp  . Abdominal hysterectomy  1962    due to hemorrhagic  . Appendectomy  1948  . Bladder suspension    . Breast surgery  nov 2013    core biopsy Dr Excell Seltzer       The following portions of the patient's history were reviewed and updated as appropriate: Allergies, current medications, and problem list.    Review of Systems:  Patient denies headache, fevers,  malaise, unintentional weight loss, skin rash, eye pain, sinus congestion and sinus pain, sore throat, dysphagia,  hemoptysis , cough, dyspnea, wheezing, chest pain, palpitations, orthopnea, edema, abdominal pain, nausea, melena, diarrhea, constipation, flank pain, dysuria, hematuria, urinary  Frequency, nocturia, numbness, tingling, seizures,  Focal weakness, Loss of consciousness,  Tremor, insomnia, depression, anxiety, and suicidal ideation.     History   Social History  . Marital Status: Single    Spouse Name: N/A    Number of Children: N/A  . Years of Education: N/A   Occupational History  . Retired     Neurosurgeon, Midwife at [employer]   Social History Main Topics  . Smoking status: Never Smoker   . Smokeless tobacco: Never Used  . Alcohol Use: No  . Drug Use: No  . Sexually Active: Not on file   Other Topics Concern  . Not on file   Social History Narrative  . No narrative on file    Objective:  BP 134/60  Pulse 63  Temp(Src) 97.5 F (36.4 C) (Oral)  Resp 16  Wt 119 lb 8 oz (54.205 kg)  BMI 23.34 kg/m2  SpO2 96%  General appearance: alert, cooperative and appears stated age Neck: no adenopathy, no carotid bruit, supple, symmetrical, trachea  midline and thyroid not enlarged, symmetric, no tenderness/mass/nodules Back: symmetric, no curvature. ROM normal. No CVA tenderness. Lungs: clear to auscultation bilaterally  Heart: regular rate and rhythm, S1, S2 normal, no murmur, click, rub or gallop Abdomen: soft, non-tender; bowel sounds normal; no masses,  no organomegaly Pulses: 2+ and symmetric Skin: Skin color, texture, turgor normal. No rashes or lesions Lymph nodes: Cervical, supraclavicular, and axillary nodes normal.  Assessment and Plan:  Invasive lobular carcinoma of breast, stage 2 S/p breast resection in November of a 1..4 cm lesion with sentinel LN biopsy. by Renda Rolls.,  Has follow up with Dr. Katrinka Blazing in March .  She is not interested in XRT or  adjuvant therapy.   Edema Mild.  Resume diuretic.   Hypertension Well controlled on current regimen. Ding back her diuretic given her LE edema .  Will recheck renal function.    Updated Medication List Outpatient Encounter Prescriptions as of 08/05/2012  Medication Sig Dispense Refill  . acetaminophen (TYLENOL) 325 MG tablet Take 650 mg by mouth every 6 (six) hours as needed.      Marland Kitchen aspirin 81 MG tablet Take 81 mg by mouth daily.        . calcium carbonate (OS-CAL) 600 MG TABS Take 600 mg by mouth 2 (two) times daily with a meal.        . diphenoxylate-atropine (LOMOTIL) 2.5-0.025 MG per tablet Take 1 tablet by mouth as needed for diarrhea or loose stools.  30 tablet  6  . enalapril (VASOTEC) 5 MG tablet Take 1 tablet (5 mg total) by mouth daily.  30 tablet  11  . latanoprost (XALATAN) 0.005 % ophthalmic solution Place 1 drop into the left eye at bedtime.      . magnesium oxide (MAG-OX) 400 MG tablet Take 400 mg by mouth 2 (two) times daily.      Marland Kitchen omeprazole (PRILOSEC) 20 MG capsule Take 20 mg by mouth daily.        . potassium chloride (K-DUR) 10 MEQ tablet Take 10 mEq by mouth daily.      . vitamin B-12 (CYANOCOBALAMIN) 100 MCG tablet Take 50 mcg by mouth daily.      . [DISCONTINUED] metoprolol tartrate (LOPRESSOR) 25 MG tablet Take 0.5 tablets (12.5 mg total) by mouth 2 (two) times daily.  90 tablet  3  . metoprolol tartrate (LOPRESSOR) 25 MG tablet Take 25 mg by mouth 2 (two) times daily.      . polyethylene glycol powder (GLYCOLAX/MIRALAX) powder       . triamterene-hydrochlorothiazide (MAXZIDE-25) 37.5-25 MG per tablet Take 1 each (1 tablet total) by mouth daily.  90 tablet  1  . [DISCONTINUED] triamterene-hydrochlorothiazide (MAXZIDE-25) 37.5-25 MG per tablet        No facility-administered encounter medications on file as of 08/05/2012.     No orders of the defined types were placed in this encounter.    No Follow-up on file.

## 2012-08-07 ENCOUNTER — Encounter: Payer: Self-pay | Admitting: Internal Medicine

## 2012-08-08 NOTE — Assessment & Plan Note (Signed)
Mild °- Resume diuretic °

## 2012-08-08 NOTE — Assessment & Plan Note (Signed)
Well controlled on current regimen. Ding back her diuretic given her LE edema .  Will recheck renal function.

## 2012-08-12 ENCOUNTER — Other Ambulatory Visit (INDEPENDENT_AMBULATORY_CARE_PROVIDER_SITE_OTHER): Payer: Medicare Other

## 2012-08-12 DIAGNOSIS — I2510 Atherosclerotic heart disease of native coronary artery without angina pectoris: Secondary | ICD-10-CM

## 2012-08-12 DIAGNOSIS — E559 Vitamin D deficiency, unspecified: Secondary | ICD-10-CM

## 2012-08-12 DIAGNOSIS — I10 Essential (primary) hypertension: Secondary | ICD-10-CM

## 2012-08-12 LAB — LIPID PANEL
LDL Cholesterol: 103 mg/dL — ABNORMAL HIGH (ref 0–99)
Total CHOL/HDL Ratio: 4
VLDL: 27 mg/dL (ref 0.0–40.0)

## 2012-08-12 LAB — COMPREHENSIVE METABOLIC PANEL
ALT: 15 U/L (ref 0–35)
AST: 18 U/L (ref 0–37)
Albumin: 4.3 g/dL (ref 3.5–5.2)
Alkaline Phosphatase: 65 U/L (ref 39–117)
BUN: 32 mg/dL — ABNORMAL HIGH (ref 6–23)
Potassium: 4.1 mEq/L (ref 3.5–5.1)
Sodium: 140 mEq/L (ref 135–145)
Total Protein: 7.1 g/dL (ref 6.0–8.3)

## 2012-09-22 ENCOUNTER — Other Ambulatory Visit: Payer: Self-pay | Admitting: *Deleted

## 2012-09-22 NOTE — Telephone Encounter (Signed)
Pharmacy Note:  Patient wants 50 for the [facility]

## 2012-09-23 MED ORDER — ENALAPRIL MALEATE 5 MG PO TABS: 5.0000 mg | ORAL_TABLET | Freq: Every day | ORAL | Status: DC

## 2012-09-23 MED ORDER — DIPHENOXYLATE-ATROPINE 2.5-0.025 MG PO TABS: 1.0000 | ORAL_TABLET | ORAL | Status: DC | PRN

## 2012-09-23 NOTE — Telephone Encounter (Signed)
Med filled.  

## 2012-10-11 ENCOUNTER — Telehealth: Payer: Self-pay | Admitting: *Deleted

## 2012-10-12 ENCOUNTER — Other Ambulatory Visit: Payer: Self-pay | Admitting: *Deleted

## 2012-10-12 NOTE — Telephone Encounter (Signed)
Patient medication sent to pharmacy  

## 2012-10-12 NOTE — Telephone Encounter (Signed)
Pharmacy called during review and verified script filled for 09/22/12 filled .

## 2012-11-16 ENCOUNTER — Encounter: Payer: Self-pay | Admitting: Adult Health

## 2012-11-16 ENCOUNTER — Ambulatory Visit (INDEPENDENT_AMBULATORY_CARE_PROVIDER_SITE_OTHER): Payer: Medicare Other | Admitting: Adult Health

## 2012-11-16 VITALS — BP 108/56 | HR 62 | Resp 12 | Wt 120.5 lb

## 2012-11-16 DIAGNOSIS — N899 Noninflammatory disorder of vagina, unspecified: Secondary | ICD-10-CM

## 2012-11-16 DIAGNOSIS — N898 Other specified noninflammatory disorders of vagina: Secondary | ICD-10-CM

## 2012-11-16 DIAGNOSIS — N763 Subacute and chronic vulvitis: Secondary | ICD-10-CM | POA: Insufficient documentation

## 2012-11-16 MED ORDER — NYSTATIN-TRIAMCINOLONE 100000-0.1 UNIT/GM-% EX OINT: TOPICAL_OINTMENT | Freq: Two times a day (BID) | CUTANEOUS | Status: DC

## 2012-11-16 NOTE — Addendum Note (Signed)
Addended by: Baldomero Lamy on: 11/16/2012 12:53 PM   Modules accepted: Orders

## 2012-11-16 NOTE — Assessment & Plan Note (Addendum)
Slightly erythematous skin around vaginal opening. Culture sent. Pt has pessary and this was removed ~ 1 month ago. ? Irritation from pessary. She has been applying premarin cream. We do not have a record of her being prescribed premarin given hx of breast cancer. She was instructed to stop the premarin and use lubricant if needed. Try Mycolog cream to affected area twice a day. RTC if symptoms not improved within 1 week. Note, greater than 25 min were spent in direct face to face communication with patient in the assessment and planning of care for this patient. Information was provided in bits and pieces. Patient was extremely vague which made the gathering of information extremely time consuming and difficult.

## 2012-11-16 NOTE — Progress Notes (Signed)
  Subjective:    Patient ID: Rachel Reynolds, female    DOB: 11-06-1926, 79 y.o.   MRN: [id number]  HPI  Patient is an 79 year old female who presents to clinic with complaints of irritation around the vagina. She has not recently been on antibiotics. She denies itching, pain. She denies incontinence. Symptoms have been ongoing for one month and she has not tried any over-the-counter products. It is difficult to obtain an exact description of what patient's symptoms are due to vagueness. She reports "my clothes bother me".  Review of Systems  Constitutional: Negative for fever and chills.  Genitourinary: Negative for dysuria, vaginal bleeding, vaginal discharge and difficulty urinating.     BP 108/56  Pulse 62  Resp 12  Wt 120 lb 8 oz (54.658 kg)  BMI 23.53 kg/m2  SpO2 97%    Objective:   Physical Exam  Constitutional: She is oriented to person, place, and time.  Genitourinary:  Mild erythema around opening of vagina. There is a white discharge- looking substance at opening. Pt reports she has a pessary and applies premarin vaginally. Pessary removed by GYN approximately 1 month ago. There is no erosion and no skin breakdown. Patient has a hx of breast cancer and should not be on premarin  Neurological: She is alert and oriented to person, place, and time.  Psychiatric:  Flat affect. Difficult to get answers from       Assessment & Plan:

## 2012-11-16 NOTE — Patient Instructions (Addendum)
  Apply mycolog cream to the affected area twice a day.  If your symptoms do not improve within 1 week please call the office.

## 2012-11-18 ENCOUNTER — Encounter: Payer: Self-pay | Admitting: *Deleted

## 2012-11-18 LAB — WET PREP BY MOLECULAR PROBE
Candida species: NEGATIVE
Trichomonas vaginosis: NEGATIVE

## 2012-11-19 ENCOUNTER — Emergency Department: Payer: Self-pay | Admitting: Emergency Medicine

## 2012-11-19 LAB — CBC
HCT: 33.4 % — ABNORMAL LOW (ref 35.0–47.0)
HGB: 11.5 g/dL — ABNORMAL LOW (ref 12.0–16.0)
MCH: 32 pg (ref 26.0–34.0)
MCV: 93 fL (ref 80–100)

## 2012-11-19 LAB — BASIC METABOLIC PANEL
Anion Gap: 5 — ABNORMAL LOW (ref 7–16)
Calcium, Total: 8.9 mg/dL (ref 8.5–10.1)
Chloride: 106 mmol/L (ref 98–107)
Creatinine: 1.32 mg/dL — ABNORMAL HIGH (ref 0.60–1.30)
EGFR (African American): 43 — ABNORMAL LOW
Glucose: 108 mg/dL — ABNORMAL HIGH (ref 65–99)
Osmolality: 283 (ref 275–301)
Potassium: 3.3 mmol/L — ABNORMAL LOW (ref 3.5–5.1)
Sodium: 139 mmol/L (ref 136–145)

## 2012-11-24 ENCOUNTER — Telehealth: Payer: Self-pay | Admitting: *Deleted

## 2012-11-24 DIAGNOSIS — R5381 Other malaise: Secondary | ICD-10-CM

## 2012-11-24 DIAGNOSIS — E785 Hyperlipidemia, unspecified: Secondary | ICD-10-CM

## 2012-11-24 NOTE — Telephone Encounter (Signed)
Pt is coming in for labs Friday 06.13.2014, what labs and dx?

## 2012-11-25 NOTE — Addendum Note (Signed)
Addended by: Sherlene Shams on: 11/25/2012 06:04 AM   Modules accepted: Orders

## 2012-11-25 NOTE — Telephone Encounter (Signed)
Orders placed.  If she is not fasting when she arrives,  Omit the lipid panel. thanks

## 2012-11-26 ENCOUNTER — Encounter: Payer: Self-pay | Admitting: Internal Medicine

## 2012-11-26 ENCOUNTER — Ambulatory Visit (INDEPENDENT_AMBULATORY_CARE_PROVIDER_SITE_OTHER): Payer: Medicare Other | Admitting: Internal Medicine

## 2012-11-26 VITALS — BP 138/60 | HR 59 | Temp 98.1°F | Resp 14 | Wt 122.0 lb

## 2012-11-26 DIAGNOSIS — N952 Postmenopausal atrophic vaginitis: Secondary | ICD-10-CM

## 2012-11-26 DIAGNOSIS — C50911 Malignant neoplasm of unspecified site of right female breast: Secondary | ICD-10-CM

## 2012-11-26 DIAGNOSIS — C50919 Malignant neoplasm of unspecified site of unspecified female breast: Secondary | ICD-10-CM

## 2012-11-26 MED ORDER — CLOBETASOL PROPIONATE 0.05 % EX OINT: TOPICAL_OINTMENT | Freq: Two times a day (BID) | CUTANEOUS | Status: DC

## 2012-11-26 NOTE — Patient Instructions (Addendum)
I am prescribing a steroid ointment for you to apply twice daily until your symptoms resolve.  You can then reduce  your use to twice weekly.  Return in 2 to 4 weeks for a recheck with Dr Darrick Huntsman

## 2012-11-26 NOTE — Assessment & Plan Note (Addendum)
Previously controlled with premarin cream which has been discontinued due to recent diagnosis of breast cancer.  Recent evaluation by Raquel ruled out infection.  Trial of steroid cream to use twice daily .Marland Kitchen  Follow up exam in 2 to 4 weeks.

## 2012-11-28 ENCOUNTER — Encounter: Payer: Self-pay | Admitting: Internal Medicine

## 2012-11-28 NOTE — Assessment & Plan Note (Signed)
Follow up mammogram and surgical eval by Renda Rolls September 2014

## 2012-11-28 NOTE — Progress Notes (Signed)
Patient ID: Rachel Reynolds, female   DOB: [birth year], 79 y.o.   MRN: [id number]  Patient Active Problem List   Diagnosis Date Noted  . Postmenopausal atrophic vaginitis 11/16/2012  . Edema 08/08/2012  . Coronary artery disease 04/23/2012  . Benign esophageal stricture   . Atypical chest pain 02/17/2012  . Invasive lobular carcinoma of breast, stage 1 02/14/2012  . Kidney disease, chronic, stage III (GFR 30-59 ml/min) 02/14/2012  . Cognitive complaints with normal neuropsychological exam 02/04/2012  . Left upper quadrant pain 02/04/2012  . Diarrhea 01/23/2012  . Dysesthesia 08/19/2011  . History of MRI of brain and brain stem   . Hypertension 08/10/2011  . Dizziness - light-headed 03/04/2011  . Anemia 02/03/2011    Subjective:  CC:   Chief Complaint  Patient presents with  . Follow-up    1 week / Vaginal area is burning and sore.    HPI:   Rachel Reynolds a 79 y.o. female who presents one week follow up of complaints of irritation around the vagina. She has not recently been on antibiotics. She denies itching, pain. She denies incontinence. Symptoms have been ongoing for one month and she has not tried any over-the-counter products. It is difficult to obtain an exact description of what patient's symptoms are due to vagueness. She continues to report that  "my clothes bother me".  She was evaluated one week ago by NP with pelvic exam and her vaginal discharge was sent or culture, which was negative for all signs of infection.   Follow up on chronic conditions, including  diagnosis of  Invasive lobular carcinoma of right breast breast cancer, T1N1M0 s/p resection by Renda Rolls Nov 2013.   Patient has deferred additional treatment with XRT/adjunctive hormonal therapy.  She feels generally well .    Past Medical History  Diagnosis Date  . Hypertension   . Vaginal delivery     4  . History of MRI of brain and brain stem Nov 2012    no evidence of macroangiopathy  .  GERD (gastroesophageal reflux disease)   . Glaucoma   . Chronic kidney disease     chronic kidney disease, stage II  . Benign esophageal stricture Oct 2013    dilated by Dr Bluford Kaufmann   . Nonmelanoma skin cancer 11/06    precancerous actinic keratosis s/p liquid nitrogen, Dr. Gwen Pounds  . breast cancer     Past Surgical History  Procedure Laterality Date  . Colon surgery    . Colon resection  2008    proximal due to precancerous polyp  . Abdominal hysterectomy  1962    due to hemorrhagic  . Appendectomy  1948  . Bladder suspension    . Breast surgery  nov 2013    core biopsy Dr Excell Seltzer       The following portions of the patient's history were reviewed and updated as appropriate: Allergies, current medications, and problem list.    Review of Systems:   Patient denies headache, fevers, malaise, unintentional weight loss, skin rash, eye pain, sinus congestion and sinus pain, sore throat, dysphagia,  hemoptysis , cough, dyspnea, wheezing, chest pain, palpitations, orthopnea, edema, abdominal pain, nausea, melena, diarrhea, constipation, flank pain, dysuria, hematuria, urinary  Frequency, nocturia, numbness, tingling, seizures,  Focal weakness, Loss of consciousness,  Tremor, insomnia, depression, anxiety, and suicidal ideation.     History   Social History  . Marital Status: Single    Spouse Name: N/A    Number of Children: N/A  .  Years of Education: N/A   Occupational History  . Retired     Neurosurgeon, Midwife at [employer]   Social History Main Topics  . Smoking status: Never Smoker   . Smokeless tobacco: Never Used  . Alcohol Use: No  . Drug Use: No  . Sexually Active: Not on file   Other Topics Concern  . Not on file   Social History Narrative  . No narrative on file    Objective:  BP 138/60  Pulse 59  Temp(Src) 98.1 F (36.7 C) (Oral)  Resp 14  Wt 122 lb (55.339 kg)  BMI 23.83 kg/m2  SpO2 98%  General Appearance:    Alert, cooperative, no distress,  appears stated age  Head:    Normocephalic, without obvious abnormality, atraumatic  Eyes:    PERRL, conjunctiva/corneas clear, EOM's intact, fundi    benign, both eyes  Ears:    Normal TM's and external ear canals, both ears  Nose:   Nares normal, septum midline, mucosa normal, no drainage    or sinus tenderness  Throat:   Lips, mucosa, and tongue normal; teeth and gums normal  Neck:   Supple, symmetrical, trachea midline, no adenopathy;    thyroid:  no enlargement/tenderness/nodules; no carotid   bruit or JVD  Back:     Symmetric, no curvature, ROM normal, no CVA tenderness  Lungs:     Clear to auscultation bilaterally, respirations unlabored  Chest Wall:    No tenderness or deformity   Heart:    Regular rate and rhythm, S1 and S2 normal, no murmur, rub   or gallop  Breast Exam:    No tenderness, masses, or nipple abnormality  Abdomen:     Soft, non-tender, bowel sounds active all four quadrants,    no masses, no organomegaly  Genitalia:    Pelvic:  external genitalia notable for bilateral labial erosions without purulence, erythema or vesicles,  vagina normal without discharge  Extremities:   Extremities normal, atraumatic, no cyanosis or edema  Pulses:   2+ and symmetric all extremities  Skin:   Skin color, texture, turgor normal, no rashes or lesions  Lymph nodes:   Cervical, supraclavicular, and axillary nodes normal  Neurologic:   CNII-XII intact, normal strength, sensation and reflexes    throughout    Assessment and Plan:  Postmenopausal atrophic vaginitis Previously controlled with premarin cream which has been discontinued due to recent diagnosis of breast cancer.  Recent evaluation by Raquel ruled out infection.  Trial of steroid cream to use twice daily .Marland Kitchen  Follow up exam in 2 to 4 weeks.   Invasive lobular carcinoma of breast, stage 1 Follow up mammogram and surgical eval by Renda Rolls September 2014   Updated Medication List Outpatient Encounter Prescriptions as  of 11/26/2012  Medication Sig Dispense Refill  . acetaminophen (TYLENOL) 325 MG tablet Take 650 mg by mouth every 6 (six) hours as needed.      Marland Kitchen aspirin 81 MG tablet Take 81 mg by mouth daily.        . calcium carbonate (OS-CAL) 600 MG TABS Take 600 mg by mouth 2 (two) times daily with a meal.        . diphenoxylate-atropine (LOMOTIL) 2.5-0.025 MG per tablet Take 1 tablet by mouth as needed for diarrhea or loose stools.  90 tablet  0  . enalapril (VASOTEC) 5 MG tablet Take 1 tablet (5 mg total) by mouth daily.  90 tablet  0  . latanoprost (XALATAN) 0.005 %  ophthalmic solution Place 1 drop into the left eye at bedtime.      . magnesium oxide (MAG-OX) 400 MG tablet Take 400 mg by mouth 2 (two) times daily.      . metoprolol tartrate (LOPRESSOR) 25 MG tablet Take 25 mg by mouth 2 (two) times daily.      Marland Kitchen omeprazole (PRILOSEC) 20 MG capsule Take 20 mg by mouth daily.        . potassium chloride (K-DUR) 10 MEQ tablet Take 10 mEq by mouth daily.      Marland Kitchen triamterene-hydrochlorothiazide (MAXZIDE-25) 37.5-25 MG per tablet Take 1 each (1 tablet total) by mouth daily.  90 tablet  1  . vitamin B-12 (CYANOCOBALAMIN) 100 MCG tablet Take 50 mcg by mouth daily.      . clobetasol ointment (TEMOVATE) 0.05 % Apply topically 2 (two) times daily.  30 g  0  . nystatin-triamcinolone ointment (MYCOLOG) Apply topically 2 (two) times daily.  30 g  0  . polyethylene glycol powder (GLYCOLAX/MIRALAX) powder        No facility-administered encounter medications on file as of 11/26/2012.     No orders of the defined types were placed in this encounter.    No Follow-up on file.

## 2012-12-09 ENCOUNTER — Ambulatory Visit (INDEPENDENT_AMBULATORY_CARE_PROVIDER_SITE_OTHER): Payer: Medicare Other | Admitting: Internal Medicine

## 2012-12-09 ENCOUNTER — Encounter: Payer: Self-pay | Admitting: Internal Medicine

## 2012-12-09 VITALS — BP 128/58 | HR 64 | Temp 98.1°F | Resp 14 | Wt 120.5 lb

## 2012-12-09 DIAGNOSIS — G4710 Hypersomnia, unspecified: Secondary | ICD-10-CM

## 2012-12-09 DIAGNOSIS — N760 Acute vaginitis: Secondary | ICD-10-CM

## 2012-12-09 DIAGNOSIS — N952 Postmenopausal atrophic vaginitis: Secondary | ICD-10-CM

## 2012-12-09 NOTE — Progress Notes (Signed)
Patient ID: Rachel Reynolds, female   DOB: [birth year], 79 y.o.   MRN: [id number]  Patient Active Problem List   Diagnosis Date Noted  . Excessive sleepiness 12/10/2012  . Postmenopausal atrophic vaginitis 11/16/2012  . Edema 08/08/2012  . Coronary artery disease 04/23/2012  . Benign esophageal stricture   . Atypical chest pain 02/17/2012  . Invasive lobular carcinoma of breast, stage 1 02/14/2012  . Kidney disease, chronic, stage III (GFR 30-59 ml/min) 02/14/2012  . Cognitive complaints with normal neuropsychological exam 02/04/2012  . Left upper quadrant pain 02/04/2012  . Diarrhea 01/23/2012  . Dysesthesia 08/19/2011  . History of MRI of brain and brain stem   . Hypertension 08/10/2011  . Dizziness - light-headed 03/04/2011  . Anemia 02/03/2011    Subjective:  CC:   Chief Complaint  Patient presents with  . Follow-up    2- 4 week rash in vaginal area still persist patient reports some better.    HPI:   Rachel Reynolds a 79 y.o. female who presents for 2 week follow up on multiple issues:    1) persistent vaginal discomfort and irritation despite 2 week trial of topical clobetasol , used twice daily.  patient still reporting itching and discomfort.   2) sleeping all the time,  Day and night .  Falling asleep in church.   Spends from 10:30 to 8 am in bed   No insomnia, not more than 2 voids per night.  Wakes up feeling refreshed., but if she lays down again at any time during the mornign or afternoon she falls back asleep .  She lives alone .  No meds implicated except metoprolol  3) legs and ankles were swollen 2 weeks ago (per patient but not today0 and her  weight has decreased by 2 lbs.  Patient wondering why they were swollen 2 weeks ago.    Past Medical History  Diagnosis Date  . Hypertension   . Vaginal delivery     4  . History of MRI of brain and brain stem Nov 2012    no evidence of macroangiopathy  . GERD (gastroesophageal reflux disease)   .  Glaucoma   . Chronic kidney disease     chronic kidney disease, stage II  . Benign esophageal stricture Oct 2013    dilated by Dr Bluford Kaufmann   . Nonmelanoma skin cancer 11/06    precancerous actinic keratosis s/p liquid nitrogen, Dr. Gwen Pounds  . breast cancer     Past Surgical History  Procedure Laterality Date  . Colon surgery    . Colon resection  2008    proximal due to precancerous polyp  . Abdominal hysterectomy  1962    due to hemorrhagic  . Appendectomy  1948  . Bladder suspension    . Breast surgery  nov 2013    core biopsy Dr Excell Seltzer       The following portions of the patient's history were reviewed and updated as appropriate: Allergies, current medications, and problem list.    Review of Systems:   12 Pt  review of systems was negative except those addressed in the HPI,     History   Social History  . Marital Status: Single    Spouse Name: N/A    Number of Children: N/A  . Years of Education: N/A   Occupational History  . Retired     Neurosurgeon, Midwife at [employer]   Social History Main Topics  . Smoking status: Never Smoker   .  Smokeless tobacco: Never Used  . Alcohol Use: No  . Drug Use: No  . Sexually Active: Not on file   Other Topics Concern  . Not on file   Social History Narrative  . No narrative on file    Objective:  BP 128/58  Pulse 64  Temp(Src) 98.1 F (36.7 C) (Oral)  Resp 14  Wt 120 lb 8 oz (54.658 kg)  BMI 23.53 kg/m2  SpO2 98%  General Appearance:    Alert, cooperative, no distress, appears stated age  Head:    Normocephalic, without obvious abnormality, atraumatic  Eyes:    PERRL, conjunctiva/corneas clear, EOM's intact, fundi    benign, both eyes  Ears:    Normal TM's and external ear canals, both ears  Nose:   Nares normal, septum midline, mucosa normal, no drainage    or sinus tenderness  Throat:   Lips, mucosa, and tongue normal; teeth and gums normal  Neck:   Supple, symmetrical, trachea midline, no  adenopathy;    thyroid:  no enlargement/tenderness/nodules; no carotid   bruit or JVD  Back:     Symmetric, no curvature, ROM normal, no CVA tenderness  Lungs:     Clear to auscultation bilaterally, respirations unlabored  Chest Wall:    No tenderness or deformity   Heart:    Regular rate and rhythm, S1 and S2 normal, no murmur, rub   or gallop  Breast Exam:    No tenderness, masses, or nipple abnormality  Abdomen:     Soft, non-tender, bowel sounds active all four quadrants,    no masses, no organomegaly  Genitalia:    Pelvic: , external genitalia normal except for mild erythema and vagina normal without discharge  Extremities:   Extremities normal, atraumatic, no cyanosis or edema  Pulses:   2+ and symmetric all extremities  Skin:   Skin color, texture, turgor normal, no rashes or lesions  Lymph nodes:   Cervical, supraclavicular, and axillary nodes normal  Neurologic:   CNII-XII intact, normal strength, sensation and reflexes    throughout    Assessment and Plan:  Postmenopausal atrophic vaginitis Exam shows resolution of labial erosions,  But she continues to report discomfort and area is diffsuely red without swelling or malodorous discharge.  I have explained that at this point a referral to gynecology is indicated as I am not comfortable resuming estrogen cream in light of her recent diagnosis of breast Ca, and taht she may need a biopsy of vaginal area to rule out Paget's Disease.  Instructed to stop the diclofenac cream since it is not helping.   Excessive sleepiness Etiology unclear.  No sedatives used,  Stop metoprolol and reasses in two weeks     Updated Medication List Outpatient Encounter Prescriptions as of 12/09/2012  Medication Sig Dispense Refill  . acetaminophen (TYLENOL) 325 MG tablet Take 650 mg by mouth every 6 (six) hours as needed.      Marland Kitchen aspirin 81 MG tablet Take 81 mg by mouth daily.        . calcium carbonate (OS-CAL) 600 MG TABS Take 600 mg by mouth 2  (two) times daily with a meal.        . clobetasol ointment (TEMOVATE) 0.05 % Apply topically 2 (two) times daily.  30 g  0  . diphenoxylate-atropine (LOMOTIL) 2.5-0.025 MG per tablet Take 1 tablet by mouth as needed for diarrhea or loose stools.  90 tablet  0  . enalapril (VASOTEC) 5 MG tablet Take 1  tablet (5 mg total) by mouth daily.  90 tablet  0  . latanoprost (XALATAN) 0.005 % ophthalmic solution Place 1 drop into the left eye at bedtime.      . magnesium oxide (MAG-OX) 400 MG tablet Take 400 mg by mouth 2 (two) times daily.      Marland Kitchen nystatin-triamcinolone ointment (MYCOLOG) Apply topically 2 (two) times daily.  30 g  0  . omeprazole (PRILOSEC) 20 MG capsule Take 20 mg by mouth daily.        . polyethylene glycol powder (GLYCOLAX/MIRALAX) powder       . potassium chloride (K-DUR) 10 MEQ tablet Take 10 mEq by mouth daily.      Marland Kitchen triamterene-hydrochlorothiazide (MAXZIDE-25) 37.5-25 MG per tablet Take 1 each (1 tablet total) by mouth daily.  90 tablet  1  . vitamin B-12 (CYANOCOBALAMIN) 100 MCG tablet Take 50 mcg by mouth daily.      . [DISCONTINUED] metoprolol tartrate (LOPRESSOR) 25 MG tablet Take 25 mg by mouth 2 (two) times daily.       No facility-administered encounter medications on file as of 12/09/2012.     Orders Placed This Encounter  Procedures  . Ambulatory referral to Gynecology    Return in about 4 weeks (around 01/06/2013).

## 2012-12-09 NOTE — Patient Instructions (Addendum)
  1) Your vaginal discomfort needs further evaluation by a gynecologist since it did not respond to the steroid cream.  i am referring you to Dr Greggory Keen.   Please stop the steroid cream   2) Your metoprolol may be making you so sleepy.,  Please stop taking it but continue the maxzide for you blood pressure and leg swelling  3) You have no leg swelling today and your weight is back down.  Avoid salty foods, and continue maxzide

## 2012-12-10 ENCOUNTER — Ambulatory Visit: Payer: Medicare Other | Admitting: Internal Medicine

## 2012-12-10 DIAGNOSIS — G4710 Hypersomnia, unspecified: Secondary | ICD-10-CM | POA: Insufficient documentation

## 2012-12-10 NOTE — Assessment & Plan Note (Signed)
Exam shows resolution of labial erosions,  But she continues to report discomfort and area is diffsuely red without swelling or malodorous discharge.  I have explained that at this point a referral to gynecology is indicated as I am not comfortable resuming estrogen cream in light of her recent diagnosis of breast Ca, and taht she may need a biopsy of vaginal area to rule out Paget's Disease.  Instructed to stop the diclofenac cream since it is not helping.

## 2012-12-10 NOTE — Assessment & Plan Note (Signed)
Etiology unclear.  No sedatives used,  Stop metoprolol and reasses in two weeks

## 2013-01-10 ENCOUNTER — Encounter: Payer: Self-pay | Admitting: Internal Medicine

## 2013-01-10 ENCOUNTER — Ambulatory Visit (INDEPENDENT_AMBULATORY_CARE_PROVIDER_SITE_OTHER): Payer: Medicare Other | Admitting: Internal Medicine

## 2013-01-10 ENCOUNTER — Other Ambulatory Visit: Payer: Self-pay | Admitting: *Deleted

## 2013-01-10 DIAGNOSIS — N763 Subacute and chronic vulvitis: Secondary | ICD-10-CM

## 2013-01-10 DIAGNOSIS — G4710 Hypersomnia, unspecified: Secondary | ICD-10-CM

## 2013-01-10 DIAGNOSIS — R5381 Other malaise: Secondary | ICD-10-CM

## 2013-01-10 DIAGNOSIS — R5383 Other fatigue: Secondary | ICD-10-CM

## 2013-01-10 DIAGNOSIS — I10 Essential (primary) hypertension: Secondary | ICD-10-CM

## 2013-01-10 DIAGNOSIS — N760 Acute vaginitis: Secondary | ICD-10-CM

## 2013-01-10 LAB — CBC WITH DIFFERENTIAL/PLATELET
Basophils Relative: 0.5 % (ref 0.0–3.0)
Eosinophils Relative: 2.3 % (ref 0.0–5.0)
HCT: 33.3 % — ABNORMAL LOW (ref 36.0–46.0)
Hemoglobin: 11.3 g/dL — ABNORMAL LOW (ref 12.0–15.0)
Lymphs Abs: 1.2 10*3/uL (ref 0.7–4.0)
MCV: 94.1 fl (ref 78.0–100.0)
Monocytes Absolute: 0.4 10*3/uL (ref 0.1–1.0)
Monocytes Relative: 7.3 % (ref 3.0–12.0)
Neutro Abs: 4.2 10*3/uL (ref 1.4–7.7)
Platelets: 169 10*3/uL (ref 150.0–400.0)
RBC: 3.54 Mil/uL — ABNORMAL LOW (ref 3.87–5.11)
WBC: 6 10*3/uL (ref 4.5–10.5)

## 2013-01-10 LAB — COMPREHENSIVE METABOLIC PANEL
Alkaline Phosphatase: 60 U/L (ref 39–117)
CO2: 29 mEq/L (ref 19–32)
Creatinine, Ser: 1.3 mg/dL — ABNORMAL HIGH (ref 0.4–1.2)
GFR: 41.83 mL/min — ABNORMAL LOW (ref >60.00)
Glucose, Bld: 115 mg/dL — ABNORMAL HIGH (ref 70–99)
Total Bilirubin: 0.6 mg/dL (ref 0.3–1.2)

## 2013-01-10 LAB — TSH: TSH: 0.79 u[IU]/mL (ref 0.35–5.50)

## 2013-01-10 NOTE — Assessment & Plan Note (Signed)
Well controlled on current regimen. Renal function stable, no changes today. 

## 2013-01-10 NOTE — Assessment & Plan Note (Signed)
secondary to medication /polypharmacy.  Improved.,  no changes today

## 2013-01-10 NOTE — Progress Notes (Signed)
Patient ID: Rachel Reynolds, female   DOB: 06-13-1927, 79 y.o.   MRN: [id number] Patient Active Problem List   Diagnosis Date Noted  . Excessive sleepiness 12/10/2012  . Chronic vulvitis 11/16/2012  . Edema 08/08/2012  . Coronary artery disease 04/23/2012  . Benign esophageal stricture   . Atypical chest pain 02/17/2012  . Invasive lobular carcinoma of breast, stage 1 02/14/2012  . Kidney disease, chronic, stage III (GFR 30-59 ml/min) 02/14/2012  . Cognitive complaints with normal neuropsychological exam 02/04/2012  . Left upper quadrant pain 02/04/2012  . Diarrhea 01/23/2012  . Dysesthesia 08/19/2011  . History of MRI of brain and brain stem   . Hypertension 08/10/2011  . Dizziness - light-headed 03/04/2011  . Anemia 02/03/2011    Subjective:  CC:   Chief Complaint  Patient presents with  . Follow-up    4 week : Patient reports having vaginal biopsy and reports no discomfort or irritation.    HPI:   Rachel Reynolds a 79 y.o. female who presents Follow up on chronic issues.  She is s/p vulvar biopsy on  July 1 by Dr . Greggory Keen which was normal.  Her symptoms have improved  with temovate., but she was having persistent irritation from one of her self absorbing sutures so she removed them about 2 weeks ago .    Polypharmacy. She is less lethargic and hypersomnolent  Today and is sleeping well.      General:  Her weight is stable. She is walking regularly.   She has had no falls,  And is ambulating using the [place].  She is requesting a walker with wheels.     Past Medical History  Diagnosis Date  . Hypertension   . Vaginal delivery     4  . History of MRI of brain and brain stem Nov 2012    no evidence of macroangiopathy  . GERD (gastroesophageal reflux disease)   . Glaucoma   . Chronic kidney disease     chronic kidney disease, stage II  . Benign esophageal stricture Oct 2013    dilated by Dr Bluford Kaufmann   . Nonmelanoma skin cancer 11/06    precancerous actinic  keratosis s/p liquid nitrogen, Dr. Gwen Pounds  . breast cancer     Past Surgical History  Procedure Laterality Date  . Colon surgery    . Colon resection  2008    proximal due to precancerous polyp  . Abdominal hysterectomy  1962    due to hemorrhagic  . Appendectomy  1948  . Bladder suspension    . Breast surgery  nov 2013    core biopsy Dr Excell Seltzer       The following portions of the patient's history were reviewed and updated as appropriate: Allergies, current medications, and problem list.    Review of Systems:   12 Pt  review of systems was negative except those addressed in the HPI,     History   Social History  . Marital Status: Single    Spouse Name: N/A    Number of Children: N/A  . Years of Education: N/A   Occupational History  . Retired     Neurosurgeon, Midwife at [employer]   Social History Main Topics  . Smoking status: Never Smoker   . Smokeless tobacco: Never Used  . Alcohol Use: No  . Drug Use: No  . Sexually Active: Not on file   Other Topics Concern  . Not on file   Social History Narrative  .  No narrative on file    Objective:  BP 118/58  Pulse 70  Temp(Src) 98.5 F (36.9 C) (Oral)  Resp 12  Wt 120 lb 8 oz (54.658 kg)  BMI 23.53 kg/m2  SpO2 98%  General Appearance:    Alert, cooperative, no distress, appears stated age  Head:    Normocephalic, without obvious abnormality, atraumatic  Eyes:    PERRL, conjunctiva/corneas clear, EOM's intact, fundi    benign, both eyes  Ears:    Normal TM's and external ear canals, both ears  Nose:   Nares normal, septum midline, mucosa normal, no drainage    or sinus tenderness  Throat:   Lips, mucosa, and tongue normal; teeth and gums normal  Neck:   Supple, symmetrical, trachea midline, no adenopathy;    thyroid:  no enlargement/tenderness/nodules; no carotid   bruit or JVD  Back:     Symmetric, no curvature, ROM normal, no CVA tenderness  Lungs:     Clear to auscultation bilaterally,  respirations unlabored  Chest Wall:    No tenderness or deformity   Heart:    Regular rate and rhythm, S1 and S2 normal, no murmur, rub   or gallop  Breast Exam:    No tenderness, masses, or nipple abnormality  Abdomen:     Soft, non-tender, bowel sounds active all four quadrants,    no masses, no organomegaly  Genitalia:    Pelvic: cervix normal in appearance, external genitalia normal, no adnexal masses or tenderness, no cervical motion tenderness, rectovaginal septum normal, uterus normal size, shape, and consistency and vagina normal without discharge  Extremities:   Extremities normal, atraumatic, no cyanosis or edema  Pulses:   2+ and symmetric all extremities  Skin:   Skin color, texture, turgor normal, no rashes or lesions  Lymph nodes:   Cervical, supraclavicular, and axillary nodes normal  Neurologic:   CNII-XII intact, normal strength, sensation and reflexes    throughout    Assessment and Plan:  Chronic vulvitis Negative vulvar biopsy July 2014.  Improved with Temovate. Advise d to use bid until endo of august    Hypertension Well controlled on current regimen. Renal function stable, no changes today.  Excessive sleepiness secondary to medication /polypharmacy.  Improved.,  no changes today    Updated Medication List Outpatient Encounter Prescriptions as of 01/10/2013  Medication Sig Dispense Refill  . acetaminophen (TYLENOL) 325 MG tablet Take 650 mg by mouth every 6 (six) hours as needed.      Marland Kitchen aspirin 81 MG tablet Take 81 mg by mouth daily.        . calcium carbonate (OS-CAL) 600 MG TABS Take 600 mg by mouth 2 (two) times daily with a meal.        . diphenoxylate-atropine (LOMOTIL) 2.5-0.025 MG per tablet Take 1 tablet by mouth as needed for diarrhea or loose stools.  90 tablet  0  . DORZOLAMIDE HCL-TIMOLOL MAL OP Apply 1 drop to eye 2 (two) times daily. 1 drop into left eye twice daily      . enalapril (VASOTEC) 5 MG tablet Take 1 tablet (5 mg total) by mouth  daily.  90 tablet  0  . latanoprost (XALATAN) 0.005 % ophthalmic solution Place 1 drop into the left eye at bedtime.      . magnesium oxide (MAG-OX) 400 MG tablet Take 400 mg by mouth 2 (two) times daily.      Marland Kitchen nystatin-triamcinolone ointment (MYCOLOG) Apply topically 2 (two) times daily.  30 g  0  . omeprazole (PRILOSEC) 20 MG capsule Take 20 mg by mouth daily.        . potassium chloride (K-DUR) 10 MEQ tablet Take 10 mEq by mouth daily.      Marland Kitchen triamterene-hydrochlorothiazide (MAXZIDE-25) 37.5-25 MG per tablet Take 1 each (1 tablet total) by mouth daily.  90 tablet  1  . vitamin B-12 (CYANOCOBALAMIN) 100 MCG tablet Take 50 mcg by mouth daily.      . clobetasol ointment (TEMOVATE) 0.05 % Apply topically 2 (two) times daily.  30 g  0  . polyethylene glycol powder (GLYCOLAX/MIRALAX) powder        No facility-administered encounter medications on file as of 01/10/2013.

## 2013-01-10 NOTE — Assessment & Plan Note (Addendum)
Negative vulvar biopsy July 2014.  Improved with Temovate. Advise d to use bid until endo of august

## 2013-01-10 NOTE — Patient Instructions (Signed)
You are doing well  Your sutures are all gone.  Continue using the cream twice daily  Until the end of august,  Then decrease use to as needed  You do not need to resume the magnesium supplements unless we advise you to do so

## 2013-01-11 MED ORDER — TRIAMTERENE-HCTZ 37.5-25 MG PO TABS: 1.0000 | ORAL_TABLET | Freq: Every day | ORAL | Status: DC

## 2013-01-11 MED ORDER — ENALAPRIL MALEATE 5 MG PO TABS: 5.0000 mg | ORAL_TABLET | Freq: Every day | ORAL | Status: DC

## 2013-01-26 ENCOUNTER — Telehealth: Payer: Self-pay | Admitting: Internal Medicine

## 2013-01-26 NOTE — Telephone Encounter (Signed)
Pt called complaining of possible bladder/kidney infection.  No appt available today.  Pt did not want to wait until tomorrow.  Transferred to triage.

## 2013-01-28 NOTE — Telephone Encounter (Signed)
Patient was treated, at urgent care.
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
## 2013-02-11 ENCOUNTER — Encounter: Payer: Self-pay | Admitting: *Deleted

## 2013-02-15 ENCOUNTER — Ambulatory Visit (INDEPENDENT_AMBULATORY_CARE_PROVIDER_SITE_OTHER): Payer: Medicare Other | Admitting: Internal Medicine

## 2013-02-15 ENCOUNTER — Encounter: Payer: Self-pay | Admitting: Internal Medicine

## 2013-02-15 ENCOUNTER — Other Ambulatory Visit: Payer: Self-pay | Admitting: Internal Medicine

## 2013-02-15 VITALS — BP 142/78 | HR 76 | Temp 97.7°F | Resp 14 | Ht 60.0 in | Wt 119.8 lb

## 2013-02-15 DIAGNOSIS — G4710 Hypersomnia, unspecified: Secondary | ICD-10-CM

## 2013-02-15 DIAGNOSIS — D649 Anemia, unspecified: Secondary | ICD-10-CM

## 2013-02-15 DIAGNOSIS — E538 Deficiency of other specified B group vitamins: Secondary | ICD-10-CM

## 2013-02-15 DIAGNOSIS — Z139 Encounter for screening, unspecified: Secondary | ICD-10-CM

## 2013-02-15 DIAGNOSIS — N182 Chronic kidney disease, stage 2 (mild): Secondary | ICD-10-CM

## 2013-02-15 LAB — POCT URINALYSIS DIPSTICK
Bilirubin, UA: NEGATIVE
Blood, UA: NEGATIVE
Glucose, UA: NEGATIVE
Ketones, UA: NEGATIVE
Nitrite, UA: NEGATIVE

## 2013-02-15 MED ORDER — CYANOCOBALAMIN 1000 MCG/ML IJ SOLN
1000.0000 ug | Freq: Once | INTRAMUSCULAR | Status: AC
  Administered 2013-02-15: 1000 ug via INTRAMUSCULAR

## 2013-02-15 NOTE — Progress Notes (Signed)
Patient ID: Rachel Reynolds, female   DOB: 1926/11/02, 79 y.o.   MRN: [id number]   Patient Active Problem List   Diagnosis Date Noted  . Excessive sleepiness 12/10/2012  . Chronic vulvitis 11/16/2012  . Edema 08/08/2012  . Coronary artery disease 04/23/2012  . Benign esophageal stricture   . Invasive lobular carcinoma of breast, stage 1 02/14/2012  . Kidney disease, chronic, stage III (GFR 30-59 ml/min) 02/14/2012  . Cognitive complaints with normal neuropsychological exam 02/04/2012  . Diarrhea 01/23/2012  . Dysesthesia 08/19/2011  . History of MRI of brain and brain stem   . Hypertension 08/10/2011  . Anemia 02/03/2011    Subjective:  CC:   Chief Complaint  Patient presents with  . Follow-up    labs and referral to nephrology  . Vaginal Itching    Vaginal itching and dysuria  . Urinary Tract Infection    HPI:   Rachel Reynolds a 79 y.o. female who presents for followup on chronic conditions including fatigue , mild renal insufficiency and chronic diarrhea. She states she has absolutely no energy.  Her son accompanies her today and has noted that she is tired after just walking around the grocery store. She denies chest pain and is not short of breath. She has had no unintentional weight loss or weight gain. She notices no lower extremity edema. She has no leg pain. She has no back pain.   Her son is concerned about her renal insufficiency. Her daughter has had multiple myeloma and has been on dialysis for several years.  She has less sedentary life for many years even before her husband died several years ago. She does not walk for exercise regularly. She only walks to and from the dining hall to her apartment which is approximately a 3 minute walk with no stairs.  After considerable discussion for 15-20 minutes she states that she has had a productive cough for the past week which occurs mostly in the morning. In the 20 minutes she's been here she has not coughed at  all. Her son has not noticed it either. She denies fevers wheezing shortness of breath and chest pain. She's had no recent travel. However her family is planning to take her to the beach next week.   Past Medical History  Diagnosis Date  . Hypertension   . Vaginal delivery     4  . History of MRI of brain and brain stem Nov 2012    no evidence of macroangiopathy  . GERD (gastroesophageal reflux disease)   . Glaucoma   . Chronic kidney disease     chronic kidney disease, stage II  . Benign esophageal stricture Oct 2013    dilated by Dr Bluford Kaufmann   . Nonmelanoma skin cancer 11/06    precancerous actinic keratosis s/p liquid nitrogen, Dr. Gwen Pounds  . breast cancer     Past Surgical History  Procedure Laterality Date  . Colon surgery    . Colon resection  2008    proximal due to precancerous polyp  . Abdominal hysterectomy  1962    due to hemorrhagic  . Appendectomy  1948  . Bladder suspension    . Breast surgery  nov 2013    core biopsy Dr Excell Seltzer       The following portions of the patient's history were reviewed and updated as appropriate: Allergies, current medications, and problem list.    Review of Systems:   12 Pt  review of systems was negative except those  addressed in the HPI,     History   Social History  . Marital Status: Single    Spouse Name: N/A    Number of Children: N/A  . Years of Education: N/A   Occupational History  . Retired     Neurosurgeon, Midwife at [employer]   Social History Main Topics  . Smoking status: Never Smoker   . Smokeless tobacco: Never Used  . Alcohol Use: No  . Drug Use: No  . Sexual Activity: Not on file   Other Topics Concern  . Not on file   Social History Narrative  . No narrative on file    Objective:  Filed Vitals:   02/15/13 1555  BP: 142/78  Pulse: 76  Temp: 97.7 F (36.5 C)  Resp: 14     General appearance: alert, cooperative and appears stated age Ears: normal TM's and external ear canals  both ears Throat: lips, mucosa, and tongue normal; teeth and gums normal Neck: no adenopathy, no carotid bruit, supple, symmetrical, trachea midline and thyroid not enlarged, symmetric, no tenderness/mass/nodules Back: symmetric, no curvature. ROM normal. No CVA tenderness. Lungs: clear to auscultation bilaterally Heart: regular rate and rhythm, S1, S2 normal, no murmur, click, rub or gallop Abdomen: soft, non-tender; bowel sounds normal; no masses,  no organomegaly Pulses: 2+ and symmetric Skin: Skin color, texture, turgor normal. No rashes or lesions Lymph nodes: Cervical, supraclavicular, and axillary nodes normal.  Assessment and Plan:  Anemia Her hemoglobin has dropped to 10.1 which is down from 11.3 one month ago. Erythropoietin level is inappropriately low normal. Renal function is stable.. Iron studies are normal. B12 is also normal.  Serum protein electrophoresis was sent and is inconclusive so serum immunofixation electrophoresis is pending.  I anticipate referral to hematology for evaluation of SPEP and urine IFE as well as for consideration of Aranesp injections to manage her anemia.  Kidney disease, chronic, stage III (GFR 30-59 ml/min) We are discontinuing her diuretic today and we'll repeat her basic metabolic panel in several weeks. Her kidney function has been stable for the past 2 years.  Excessive sleepiness Her medication list has been reviewed and there are no offending agents. I suspect that her fatigue and sleepiness is in part due to an inactive lifestyle. There were sure to start a formal walking program. I have also offered a referral to rehabilitation for strength training but she is not interested in this at this time.  A total of 40 minutes was spent with patient more than half of which was spent in counseling, reviewing records from other prviders and coordination of care.  Updated Medication List Outpatient Encounter Prescriptions as of 02/15/2013  Medication  Sig Dispense Refill  . acetaminophen (TYLENOL) 325 MG tablet Take 650 mg by mouth every 6 (six) hours as needed.      Marland Kitchen aspirin 81 MG tablet Take 81 mg by mouth daily.        . calcium carbonate (OS-CAL) 600 MG TABS Take 600 mg by mouth 2 (two) times daily with a meal.        . clobetasol ointment (TEMOVATE) 0.05 % Apply topically 2 (two) times daily.  30 g  0  . diphenoxylate-atropine (LOMOTIL) 2.5-0.025 MG per tablet Take 1 tablet by mouth as needed for diarrhea or loose stools.  90 tablet  0  . DORZOLAMIDE HCL-TIMOLOL MAL OP Apply 1 drop to eye 2 (two) times daily. 1 drop into left eye twice daily      .  enalapril (VASOTEC) 5 MG tablet Take 1 tablet (5 mg total) by mouth daily.  90 tablet  1  . latanoprost (XALATAN) 0.005 % ophthalmic solution Place 1 drop into the left eye at bedtime.      . magnesium oxide (MAG-OX) 400 MG tablet Take 400 mg by mouth 2 (two) times daily.      Marland Kitchen nystatin-triamcinolone ointment (MYCOLOG) Apply topically 2 (two) times daily.  30 g  0  . omeprazole (PRILOSEC) 20 MG capsule Take 20 mg by mouth daily.        . potassium chloride (K-DUR) 10 MEQ tablet Take 10 mEq by mouth daily.      Marland Kitchen triamterene-hydrochlorothiazide (MAXZIDE-25) 37.5-25 MG per tablet Take 1 tablet by mouth daily.  90 tablet  1  . vitamin B-12 (CYANOCOBALAMIN) 100 MCG tablet Take 50 mcg by mouth daily.      . polyethylene glycol powder (GLYCOLAX/MIRALAX) powder       . [EXPIRED] cyanocobalamin ((VITAMIN B-12)) injection 1,000 mcg        No facility-administered encounter medications on file as of 02/15/2013.     Orders Placed This Encounter  Procedures  . B12  . CBC with Differential  . Erythropoietin  . Iron and TIBC  . Ferritin  . Comprehensive metabolic panel  . Protein electrophoresis, serum  . IFE and PE, Serum  . Basic metabolic panel  . Vit D  25 hydroxy (rtn osteoporosis monitoring)  . Phosphorus  . PTH, intact (no Ca)  . POCT urinalysis dipstick    No Follow-up on file.

## 2013-02-15 NOTE — Patient Instructions (Addendum)
You do not have an infection.    You should go to the beach next week and enjoy yourself  If your labs are normal,  We will repeat your ECHO to evaluate your heart   I want you to walk for 10 minutes daily

## 2013-02-16 LAB — IRON AND TIBC
%SAT: 11 % — ABNORMAL LOW (ref 20–55)
Iron: 31 ug/dL — ABNORMAL LOW (ref 42–145)
TIBC: 286 ug/dL (ref 250–470)

## 2013-02-16 LAB — COMPREHENSIVE METABOLIC PANEL
AST: 17 U/L (ref 0–37)
BUN: 28 mg/dL — ABNORMAL HIGH (ref 6–23)
Calcium: 9.4 mg/dL (ref 8.4–10.5)
Chloride: 99 mEq/L (ref 96–112)
Creatinine, Ser: 1.2 mg/dL (ref 0.4–1.2)
Glucose, Bld: 93 mg/dL (ref 70–99)

## 2013-02-16 LAB — ERYTHROPOIETIN: Erythropoietin: 8.5 m[IU]/mL (ref 2.6–18.5)

## 2013-02-16 LAB — CBC WITH DIFFERENTIAL/PLATELET
Basophils Absolute: 0 10*3/uL (ref 0.0–0.1)
Eosinophils Relative: 1.7 % (ref 0.0–5.0)
Monocytes Absolute: 0.7 10*3/uL (ref 0.1–1.0)
Monocytes Relative: 8.1 % (ref 3.0–12.0)
Neutrophils Relative %: 75.1 % (ref 43.0–77.0)
Platelets: 259 10*3/uL (ref 150.0–400.0)
WBC: 8.4 10*3/uL (ref 4.5–10.5)

## 2013-02-16 LAB — VITAMIN B12: Vitamin B-12: 663 pg/mL (ref 211–911)

## 2013-02-17 ENCOUNTER — Encounter: Payer: Self-pay | Admitting: Internal Medicine

## 2013-02-17 ENCOUNTER — Telehealth: Payer: Self-pay | Admitting: Internal Medicine

## 2013-02-17 LAB — PROTEIN ELECTROPHORESIS, SERUM
Albumin ELP: 53 % — ABNORMAL LOW (ref 55.8–66.1)
Alpha-1-Globulin: 8.7 % — ABNORMAL HIGH (ref 2.9–4.9)
Beta 2: 5.9 % (ref 3.2–6.5)
Total Protein, Serum Electrophoresis: 6.7 g/dL (ref 6.0–8.3)

## 2013-02-17 NOTE — Assessment & Plan Note (Signed)
We are discontinuing her diuretic today and we'll repeat her basic metabolic panel in several weeks. Her kidney function has been stable for the past 2 years.

## 2013-02-17 NOTE — Assessment & Plan Note (Signed)
Her medication list has been reviewed and there are no offending agents. I suspect that her fatigue and sleepiness is in part due to an inactive lifestyle. There were sure to start a formal walking program. I have also offered a referral to rehabilitation for strength training but she is not interested in this at this time.

## 2013-02-17 NOTE — Telephone Encounter (Signed)
Rachel Reynolds I have added serum IFE that I  today to the prior blood draw.   the LAB said they would hold her blood for a week if I wanted to add this.

## 2013-02-17 NOTE — Telephone Encounter (Signed)
Called [facility] the serum IFE is being added

## 2013-02-17 NOTE — Assessment & Plan Note (Addendum)
Her hemoglobin has dropped to 10.1 which is down from 11.3 one month ago. Erythropoietin level is inappropriately low normal. Renal function is stable.. Iron studies are normal. B12 is also normal.  Serum protein electrophoresis was sent and is inconclusive so serum immunofixation electrophoresis is pending.  I anticipate referral to hematology for evaluation of SPEP and urine IFE as well as for consideration of Aranesp injections to manage her anemia.

## 2013-02-19 NOTE — Addendum Note (Signed)
Addended by: Sherlene Shams on: 02/19/2013 06:15 PM   Modules accepted: Orders

## 2013-02-21 LAB — IMMUNOFIXATION ELECTROPHORESIS
IgA: 346 mg/dL (ref 69–380)
IgG (Immunoglobin G), Serum: 623 mg/dL — ABNORMAL LOW (ref 690–1700)
IgM, Serum: 42 mg/dL — ABNORMAL LOW (ref 52–322)
Total Protein, Serum Electrophoresis: 7 g/dL (ref 6.0–8.3)

## 2013-02-24 ENCOUNTER — Ambulatory Visit: Payer: Self-pay | Admitting: Surgery

## 2013-02-25 ENCOUNTER — Telehealth: Payer: Self-pay | Admitting: Internal Medicine

## 2013-02-25 NOTE — Telephone Encounter (Signed)
Pt states she was told she could get another B12 shot if needed.  Pt asking if she can get this at [facility] or if she has to come to the office for the B12.  Asking if they can come to pt because pt does not have a ride.

## 2013-03-01 NOTE — Telephone Encounter (Signed)
Left message for patient to return my call. Call the office.

## 2013-03-03 NOTE — Telephone Encounter (Signed)
The patient will get her B-12 when she comes in for her appointment.

## 2013-03-09 ENCOUNTER — Other Ambulatory Visit (INDEPENDENT_AMBULATORY_CARE_PROVIDER_SITE_OTHER): Payer: Medicare Other

## 2013-03-09 ENCOUNTER — Other Ambulatory Visit: Payer: Medicare Other

## 2013-03-09 DIAGNOSIS — N182 Chronic kidney disease, stage 2 (mild): Secondary | ICD-10-CM

## 2013-03-09 DIAGNOSIS — D649 Anemia, unspecified: Secondary | ICD-10-CM

## 2013-03-09 DIAGNOSIS — Z23 Encounter for immunization: Secondary | ICD-10-CM

## 2013-03-09 LAB — BASIC METABOLIC PANEL
BUN: 18 mg/dL (ref 6–23)
CO2: 30 mEq/L (ref 19–32)
Calcium: 9.2 mg/dL (ref 8.4–10.5)
Chloride: 104 mEq/L (ref 96–112)
Creatinine, Ser: 1 mg/dL (ref 0.4–1.2)

## 2013-03-10 ENCOUNTER — Encounter: Payer: Self-pay | Admitting: *Deleted

## 2013-03-10 ENCOUNTER — Other Ambulatory Visit: Payer: Medicare Other

## 2013-03-24 NOTE — Addendum Note (Signed)
Addended by: Theola Sequin on: 03/24/2013 01:55 PM   Modules accepted: Orders

## 2013-03-30 ENCOUNTER — Encounter: Payer: Self-pay | Admitting: Internal Medicine

## 2013-03-30 ENCOUNTER — Telehealth: Payer: Self-pay | Admitting: Internal Medicine

## 2013-03-30 ENCOUNTER — Ambulatory Visit (INDEPENDENT_AMBULATORY_CARE_PROVIDER_SITE_OTHER): Payer: Medicare Other | Admitting: Internal Medicine

## 2013-03-30 VITALS — BP 120/60 | HR 85 | Temp 98.2°F | Resp 14 | Wt 117.5 lb

## 2013-03-30 DIAGNOSIS — R319 Hematuria, unspecified: Secondary | ICD-10-CM

## 2013-03-30 DIAGNOSIS — N390 Urinary tract infection, site not specified: Secondary | ICD-10-CM

## 2013-03-30 LAB — POCT URINALYSIS DIPSTICK
Glucose, UA: NEGATIVE
Ketones, UA: NEGATIVE
Spec Grav, UA: 1.02

## 2013-03-30 MED ORDER — CIPROFLOXACIN HCL 250 MG PO TABS: 250.0000 mg | ORAL_TABLET | Freq: Two times a day (BID) | ORAL | Status: DC

## 2013-03-30 MED ORDER — CULTURELLE PO CAPS: 1.0000 | ORAL_CAPSULE | Freq: Every day | ORAL | Status: DC

## 2013-03-30 MED ORDER — PHENAZOPYRIDINE HCL 200 MG PO TABS: 200.0000 mg | ORAL_TABLET | Freq: Three times a day (TID) | ORAL | Status: DC | PRN

## 2013-03-30 NOTE — Telephone Encounter (Signed)
Please give her the 11:15 appt.  Get a urine speciemen ASAP

## 2013-03-30 NOTE — Telephone Encounter (Signed)
Called pt.  11:15 appt confirmed. Pt advised to give urine specimen on arrival.

## 2013-03-30 NOTE — Patient Instructions (Signed)
Please use a sterile container to collect your urine the next time  I am treating your for a UTI with ciprofloxacin . Take it twice daily with food for 5 days.  you can take pyridium for the painful burning,  Every 8 hours as needed  I also recommend that you take a probiotic like Culturelle or Floraque OR  Eat a cup of yogurt daily to prevent the bacteria in your colon from getting out of balance and causing a terrible  diarrheal infection called " C dif colitis"

## 2013-03-30 NOTE — Progress Notes (Signed)
Patient ID: Rachel Reynolds, female   DOB: 01/21/1927, 79 y.o.   MRN: [id number]  Patient Active Problem List   Diagnosis Date Noted  . UTI (urinary tract infection) 03/30/2013  . Excessive sleepiness 12/10/2012  . Chronic vulvitis 11/16/2012  . Edema 08/08/2012  . Coronary artery disease 04/23/2012  . Benign esophageal stricture   . Invasive lobular carcinoma of breast, stage 1 02/14/2012  . Kidney disease, chronic, stage III (GFR 30-59 ml/min) 02/14/2012  . Cognitive complaints with normal neuropsychological exam 02/04/2012  . Diarrhea 01/23/2012  . Dysesthesia 08/19/2011  . History of MRI of brain and brain stem   . Hypertension 08/10/2011  . Anemia 02/03/2011    Subjective:  CC:   Chief Complaint  Patient presents with  . Hematuria  . Dysuria    HPI:   Rachel Reynolds a 79 y.o. female who presents with a history of passing clots of blood since this morning associatd with pain and burning.   She denies  nausea back pain or fevers. She has not had any recent travel she does not douche. She has not been on antibiotics recently. Not taking an anticoagulant   Past Medical History  Diagnosis Date  . Hypertension   . Vaginal delivery     4  . History of MRI of brain and brain stem Nov 2012    no evidence of macroangiopathy  . GERD (gastroesophageal reflux disease)   . Glaucoma   . Chronic kidney disease     chronic kidney disease, stage II  . Benign esophageal stricture Oct 2013    dilated by Dr Bluford Kaufmann   . Nonmelanoma skin cancer 11/06    precancerous actinic keratosis s/p liquid nitrogen, Dr. Gwen Pounds  . breast cancer     Past Surgical History  Procedure Laterality Date  . Colon surgery    . Colon resection  2008    proximal due to precancerous polyp  . Abdominal hysterectomy  1962    due to hemorrhagic  . Appendectomy  1948  . Bladder suspension    . Breast surgery  nov 2013    core biopsy Dr Excell Seltzer       The following portions of the patient's  history were reviewed and updated as appropriate: Allergies, current medications, and problem list.    Review of Systems:   12 Pt  review of systems was negative except those addressed in the HPI,     History   Social History  . Marital Status: Single    Spouse Name: N/A    Number of Children: N/A  . Years of Education: N/A   Occupational History  . Retired     Neurosurgeon, Midwife at [employer]   Social History Main Topics  . Smoking status: Never Smoker   . Smokeless tobacco: Never Used  . Alcohol Use: No  . Drug Use: No  . Sexual Activity: Not on file   Other Topics Concern  . Not on file   Social History Narrative  . No narrative on file    Objective:  Filed Vitals:   03/30/13 1116  BP: 120/60  Pulse: 85  Temp: 98.2 F (36.8 C)  Resp: 14     General appearance: alert, cooperative and appears stated age Neck: no adenopathy, no carotid bruit, supple, symmetrical, trachea midline and thyroid not enlarged, symmetric, no tenderness/mass/nodules Back: symmetric, no curvature. ROM normal. No CVA tenderness. Lungs: clear to auscultation bilaterally Heart: regular rate and rhythm, S1, S2 normal, no  murmur, click, rub or gallop Abdomen: soft, non-tender; bowel sounds normal; no masses,  no organomegaly Pulses: 2+ and symmetric Skin: Skin color, texture, turgor normal. No rashes or lesions Lymph nodes: Cervical, supraclavicular, and axillary nodes normal.  Assessment and Plan:  UTI (urinary tract infection) Empiric treatment with Cipro, and Pyridium for pain pending culture data. Unfortunately patient's urine was collected in a nonsterile container. I've given her still continues to use in the future. Advised to take a daily probiotics while on antibiotics to minimize the risk for antibiotic associated diarrhea.   Updated Medication List Outpatient Encounter Prescriptions as of 03/30/2013  Medication Sig Dispense Refill  . acetaminophen (TYLENOL) 325 MG  tablet Take 650 mg by mouth every 6 (six) hours as needed.      Marland Kitchen aspirin 81 MG tablet Take 81 mg by mouth daily.        . calcium carbonate (OS-CAL) 600 MG TABS Take 600 mg by mouth 2 (two) times daily with a meal.        . clobetasol ointment (TEMOVATE) 0.05 % Apply topically 2 (two) times daily.  30 g  0  . diphenoxylate-atropine (LOMOTIL) 2.5-0.025 MG per tablet Take 1 tablet by mouth as needed for diarrhea or loose stools.  90 tablet  0  . DORZOLAMIDE HCL-TIMOLOL MAL OP Apply 1 drop to eye 2 (two) times daily. 1 drop into left eye twice daily      . enalapril (VASOTEC) 5 MG tablet Take 1 tablet (5 mg total) by mouth daily.  90 tablet  1  . latanoprost (XALATAN) 0.005 % ophthalmic solution Place 1 drop into the left eye at bedtime.      . magnesium oxide (MAG-OX) 400 MG tablet Take 400 mg by mouth 2 (two) times daily.      Marland Kitchen nystatin-triamcinolone ointment (MYCOLOG) Apply topically 2 (two) times daily.  30 g  0  . omeprazole (PRILOSEC) 20 MG capsule Take 20 mg by mouth daily.        . polyethylene glycol powder (GLYCOLAX/MIRALAX) powder       . potassium chloride (K-DUR) 10 MEQ tablet Take 10 mEq by mouth daily.      . vitamin B-12 (CYANOCOBALAMIN) 100 MCG tablet Take 50 mcg by mouth daily.      . ciprofloxacin (CIPRO) 250 MG tablet Take 1 tablet (250 mg total) by mouth 2 (two) times daily.  10 tablet  0  . Lactobacillus Rhamnosus, GG, (CULTURELLE) CAPS Take 1 capsule by mouth daily.  30 capsule  0  . phenazopyridine (PYRIDIUM) 200 MG tablet Take 1 tablet (200 mg total) by mouth 3 (three) times daily as needed for pain.  10 tablet  0  . triamterene-hydrochlorothiazide (MAXZIDE-25) 37.5-25 MG per tablet Take 1 tablet by mouth daily.  90 tablet  1   No facility-administered encounter medications on file as of 03/30/2013.     Orders Placed This Encounter  Procedures  . Urine culture  . POCT Urinalysis Dipstick    No Follow-up on file.

## 2013-03-30 NOTE — Telephone Encounter (Signed)
Pt having blood in urine, fresh blood and clots, and pain since about 5:30 a.m.  No appt available.  Can pt be seen?

## 2013-03-31 LAB — URINE CULTURE: Colony Count: 6000

## 2013-04-01 NOTE — Assessment & Plan Note (Addendum)
Empiric treatment with Cipro, and Pyridium for pain pending culture data. Unfortunately patient's urine was collected in a nonsterile container. I've given her still continues to use in the future. Advised to take a daily probiotics while on antibiotics to minimize the risk for antibiotic associated diarrhea.

## 2013-04-12 ENCOUNTER — Encounter: Payer: Self-pay | Admitting: Internal Medicine

## 2013-04-12 ENCOUNTER — Ambulatory Visit (INDEPENDENT_AMBULATORY_CARE_PROVIDER_SITE_OTHER): Payer: Medicare Other | Admitting: Internal Medicine

## 2013-04-12 ENCOUNTER — Telehealth: Payer: Self-pay | Admitting: Emergency Medicine

## 2013-04-12 ENCOUNTER — Ambulatory Visit: Payer: Self-pay | Admitting: Internal Medicine

## 2013-04-12 VITALS — BP 136/66 | HR 67 | Temp 98.2°F | Resp 12 | Wt 119.5 lb

## 2013-04-12 DIAGNOSIS — N763 Subacute and chronic vulvitis: Secondary | ICD-10-CM

## 2013-04-12 DIAGNOSIS — Z23 Encounter for immunization: Secondary | ICD-10-CM

## 2013-04-12 DIAGNOSIS — R310 Gross hematuria: Secondary | ICD-10-CM | POA: Insufficient documentation

## 2013-04-12 DIAGNOSIS — N760 Acute vaginitis: Secondary | ICD-10-CM

## 2013-04-12 LAB — POCT URINALYSIS DIPSTICK
Bilirubin, UA: NEGATIVE
Blood, UA: NEGATIVE
Glucose, UA: NEGATIVE
Ketones, UA: NEGATIVE
Protein, UA: 100

## 2013-04-12 NOTE — Progress Notes (Signed)
Patient ID: Rachel Reynolds, female   DOB: Oct 11, 1926, 79 y.o.   MRN: [id number] Patient Active Problem List   Diagnosis Date Noted  . Hematuria, gross 04/12/2013  . Excessive sleepiness 12/10/2012  . Chronic vulvitis 11/16/2012  . Edema 08/08/2012  . Coronary artery disease 04/23/2012  . Benign esophageal stricture   . Invasive lobular carcinoma of breast, stage 1 02/14/2012  . Kidney disease, chronic, stage III (GFR 30-59 ml/min) 02/14/2012  . Cognitive complaints with normal neuropsychological exam 02/04/2012  . Diarrhea 01/23/2012  . Dysesthesia 08/19/2011  . History of MRI of brain and brain stem   . Hypertension 08/10/2011  . Anemia 02/03/2011    Subjective:  CC:   Chief Complaint  Patient presents with  . Follow-up    3 month    HPI:   Rachel Reynolds a 79 y.o. female who presents after an isolated episode of severe suprapubic pain accompanied by bloody show. Occurred wo weekends ago, just a day or two after I had evaluated her with pelvic exam for dysuria.  She did not go to ER or make an urgent appt and the symptoms have nt recurred.  Two weeks ago prior to last episode she was treated empirically for UTI but urine culture proved to be negative,  No bowel issues currently ,  Has recently transitioned to normal stools when previously she was having loose stools on a regular basis. Marland Kitchen  She was woken up by pain in her vaginal area and passing of clots and had a lot of pain at the time and it woke her from sleep  Has been taking tylenol as needed but never more than 2 daily bc they make her drowsy    Past Medical History  Diagnosis Date  . Hypertension   . Vaginal delivery     4  . History of MRI of brain and brain stem Nov 2012    no evidence of macroangiopathy  . GERD (gastroesophageal reflux disease)   . Glaucoma   . Chronic kidney disease     chronic kidney disease, stage II  . Benign esophageal stricture Oct 2013    dilated by Dr Bluford Kaufmann   . Nonmelanoma skin  cancer 11/06    precancerous actinic keratosis s/p liquid nitrogen, Dr. Gwen Pounds  . breast cancer     Past Surgical History  Procedure Laterality Date  . Colon surgery    . Colon resection  2008    proximal due to precancerous polyp  . Abdominal hysterectomy  1962    due to hemorrhagic  . Appendectomy  1948  . Bladder suspension    . Breast surgery  nov 2013    core biopsy Dr Excell Seltzer       The following portions of the patient's history were reviewed and updated as appropriate: Allergies, current medications, and problem list.    Review of Systems:   Patient denies headache, fevers, malaise, unintentional weight loss, skin rash, eye pain, sinus congestion and sinus pain, sore throat, dysphagia,  hemoptysis , cough, dyspnea, wheezing, chest pain, palpitations, orthopnea, edema, abdominal pain, nausea, melena, diarrhea, constipation, flank pain, dysuria, hematuria, urinary  Frequency, nocturia, numbness, tingling, seizures,  Focal weakness, Loss of consciousness,  Tremor, insomnia, depression, anxiety, and suicidal ideation.     History   Social History  . Marital Status: Single    Spouse Name: N/A    Number of Children: N/A  . Years of Education: N/A   Occupational History  . Retired  Seamstress, Midwife at [employer]   Social History Main Topics  . Smoking status: Never Smoker   . Smokeless tobacco: Never Used  . Alcohol Use: No  . Drug Use: No  . Sexual Activity: No   Other Topics Concern  . Not on file   Social History Narrative  . No narrative on file    Objective:  Filed Vitals:   04/12/13 0941  BP: 136/66  Pulse: 67  Temp: 98.2 F (36.8 C)  Resp: 12     General appearance: alert, cooperative and appears stated age Ears: normal TM's and external ear canals both ears Throat: lips, mucosa, and tongue normal; teeth and gums normal Neck: no adenopathy, no carotid bruit, supple, symmetrical, trachea midline and thyroid not enlarged,  symmetric, no tenderness/mass/nodules Back: symmetric, no curvature. ROM normal. No CVA tenderness. Lungs: clear to auscultation bilaterally Heart: regular rate and rhythm, S1, S2 normal, no murmur, click, rub or gallop Abdomen: soft, non-tender; bowel sounds normal; no masses,  no organomegaly Pulses: 2+ and symmetric Skin: Skin color, texture, turgor normal. No rashes or lesions Lymph nodes: Cervical, supraclavicular, and axillary nodes normal.  Assessment and Plan:  Chronic vulvitis Negative vulvar biopsy July 2014.  Improved with Temovate. Reminded here to use it twice weekly.Marland Kitchen  Has a pessary  Hematuria, gross Intermittent, with last episode becoming painful and occurring two weeks ago,.  CT  scan of abd and pelvis with Stone protocol was done today at [facility] did not identify any nephrogenic calculi,.  Diffuse diverticulosis was noted without evidence of diverticulitis.  Referral to Dr Achilles Dunk for cystoscopy discussed with patient if CT was negative for stones.    Updated Medication List Outpatient Encounter Prescriptions as of 04/12/2013  Medication Sig Dispense Refill  . acetaminophen (TYLENOL) 325 MG tablet Take 650 mg by mouth every 6 (six) hours as needed.      Marland Kitchen aspirin 81 MG tablet Take 81 mg by mouth daily.        . calcium carbonate (OS-CAL) 600 MG TABS Take 600 mg by mouth 2 (two) times daily with a meal.        . clobetasol ointment (TEMOVATE) 0.05 % Apply topically 2 (two) times daily.  30 g  0  . diphenoxylate-atropine (LOMOTIL) 2.5-0.025 MG per tablet Take 1 tablet by mouth as needed for diarrhea or loose stools.  90 tablet  0  . DORZOLAMIDE HCL-TIMOLOL MAL OP Apply 1 drop to eye 2 (two) times daily. 1 drop into left eye twice daily      . enalapril (VASOTEC) 5 MG tablet Take 1 tablet (5 mg total) by mouth daily.  90 tablet  1  . latanoprost (XALATAN) 0.005 % ophthalmic solution Place 1 drop into the left eye at bedtime.      Marland Kitchen omeprazole (PRILOSEC) 20 MG capsule Take 20 mg  by mouth daily.        . potassium chloride (K-DUR) 10 MEQ tablet Take 10 mEq by mouth daily.      . vitamin B-12 (CYANOCOBALAMIN) 100 MCG tablet Take 50 mcg by mouth daily.      . magnesium oxide (MAG-OX) 400 MG tablet Take 400 mg by mouth 2 (two) times daily.      Marland Kitchen nystatin-triamcinolone ointment (MYCOLOG) Apply topically 2 (two) times daily.  30 g  0  . polyethylene glycol powder (GLYCOLAX/MIRALAX) powder       . triamterene-hydrochlorothiazide (MAXZIDE-25) 37.5-25 MG per tablet Take 1 tablet by mouth daily.  90 tablet  1  . [DISCONTINUED] ciprofloxacin (CIPRO) 250 MG tablet Take 1 tablet (250 mg total) by mouth 2 (two) times daily.  10 tablet  0  . [DISCONTINUED] Lactobacillus Rhamnosus, GG, (CULTURELLE) CAPS Take 1 capsule by mouth daily.  30 capsule  0  . [DISCONTINUED] phenazopyridine (PYRIDIUM) 200 MG tablet Take 1 tablet (200 mg total) by mouth 3 (three) times daily as needed for pain.  10 tablet  0   No facility-administered encounter medications on file as of 04/12/2013.     Orders Placed This Encounter  Procedures  . CULTURE, URINE COMPREHENSIVE  . CT Abdomen Pelvis Wo Contrast  . Ambulatory referral to Urology  . POCT Urinalysis Dipstick    No Follow-up on file.

## 2013-04-12 NOTE — Patient Instructions (Signed)
Please continue using the clobetasol ointment twice  A week to keep you itching and burning under control  We are ordering a CT of your abdomen and plevis without contrast to rule out kidney stones  If it is negative,  We refer to Urology for evaluation of your bladder

## 2013-04-12 NOTE — Assessment & Plan Note (Addendum)
Intermittent, with last episode becoming painful and occurring two weeks ago,.  CT  scan of abd and pelvis with Stone protocol was done today at [facility] did not identify any nephrogenic calculi,.  Diffuse diverticulosis was noted without evidence of diverticulitis.  Referral to Dr Achilles Dunk for cystoscopy discussed with patient if CT was negative for stones.

## 2013-04-12 NOTE — Assessment & Plan Note (Addendum)
Negative vulvar biopsy July 2014.  Improved with Temovate. Reminded here to use it twice weekly.Marland Kitchen  Has a pessary

## 2013-04-12 NOTE — Telephone Encounter (Signed)
Pt wondering if she can go back on the maxide. Please advise.

## 2013-04-13 ENCOUNTER — Telehealth: Payer: Self-pay | Admitting: Internal Medicine

## 2013-04-13 NOTE — Telephone Encounter (Signed)
CT scan was negative for stones,  Showed only lots of diverticulosis.  We will proceed with Urology evaluation

## 2013-04-13 NOTE — Telephone Encounter (Signed)
Patient notified as requested. 

## 2013-04-14 LAB — CULTURE, URINE COMPREHENSIVE: Colony Count: NO GROWTH

## 2013-05-02 ENCOUNTER — Encounter: Payer: Self-pay | Admitting: Internal Medicine

## 2013-05-20 ENCOUNTER — Telehealth: Payer: Self-pay | Admitting: Internal Medicine

## 2013-05-20 MED ORDER — DIPHENOXYLATE-ATROPINE 2.5-0.025 MG PO TABS: 1.0000 | ORAL_TABLET | ORAL | Status: DC | PRN

## 2013-05-20 NOTE — Telephone Encounter (Signed)
Script sent electronically, notified patient

## 2013-05-20 NOTE — Telephone Encounter (Signed)
Lomotil needed.  Pt is at pharmacy.  States pharmacy says they sent a request.

## 2013-06-27 ENCOUNTER — Ambulatory Visit (INDEPENDENT_AMBULATORY_CARE_PROVIDER_SITE_OTHER): Payer: Medicare HMO | Admitting: Internal Medicine

## 2013-06-27 ENCOUNTER — Encounter: Payer: Self-pay | Admitting: Internal Medicine

## 2013-06-27 VITALS — BP 126/54 | HR 64 | Temp 98.0°F | Resp 14 | Wt 117.0 lb

## 2013-06-27 DIAGNOSIS — B9789 Other viral agents as the cause of diseases classified elsewhere: Principal | ICD-10-CM

## 2013-06-27 DIAGNOSIS — J069 Acute upper respiratory infection, unspecified: Secondary | ICD-10-CM

## 2013-06-27 DIAGNOSIS — J189 Pneumonia, unspecified organism: Secondary | ICD-10-CM | POA: Insufficient documentation

## 2013-06-27 MED ORDER — BENZONATATE 100 MG PO CAPS: 100.0000 mg | ORAL_CAPSULE | Freq: Three times a day (TID) | ORAL | Status: DC | PRN

## 2013-06-27 NOTE — Patient Instructions (Addendum)
You have a viral  Syndrome .  The post nasal drip is causing your cough.  Flush your sinuses twice daily with Simply saline nasal spray.  You can use benadryl 25 mg every 8 hours for runny nose and Sudafed PE 10 ng every 8 hours if needed foir  Sinus  congestion.  Gargle with salt water often for the sore throat.  I am calling in benzonatate cough capsules to take every 8 hours as needed for  cough.  If you develop a fever ( T > 100.4),  Green nasal discharge,  Or facial /ear pain,  Call for an antibiotic.

## 2013-06-27 NOTE — Progress Notes (Signed)
Pre-visit discussion using our clinic review tool. No additional management support is needed unless otherwise documented below in the visit note.  

## 2013-06-27 NOTE — Progress Notes (Signed)
Patient ID: Rachel Reynolds, female   DOB: 1927-05-13, 80 y.o.   MRN: [id number]   Patient Active Problem List   Diagnosis Date Noted  . Viral URI with cough 06/27/2013  . Hematuria, gross 04/12/2013  . Excessive sleepiness 12/10/2012  . Chronic vulvitis 11/16/2012  . Edema 08/08/2012  . Coronary artery disease 04/23/2012  . Benign esophageal stricture   . Invasive lobular carcinoma of breast, stage 1 02/14/2012  . Kidney disease, chronic, stage III (GFR 30-59 ml/min) 02/14/2012  . Cognitive complaints with normal neuropsychological exam 02/04/2012  . Diarrhea 01/23/2012  . Dysesthesia 08/19/2011  . History of MRI of brain and brain stem   . Hypertension 08/10/2011  . Anemia 02/03/2011    Subjective:  CC:   Chief Complaint  Patient presents with  . Cough    congestion X 3 days, nonproductive cough afebrile.    HPI:   Rachel Reynolds a 80 y.o. female who presents 3 day history of cough for 3 days ,  No sore throat or fever,  But feels it is in her chest .  Cough is nonproductive .  Not particularly worse at night.  using  Cough drops and some OTC cough syrup which caused immediate diarrhea. Which has resolved.  No myalgias. No ear pain .  Clear rhinorrhea in the morning. No dyspnea or pleurisy but hurts deep in her chest when she coughs.    Past Medical History  Diagnosis Date  . Hypertension   . Vaginal delivery     4  . History of MRI of brain and brain stem Nov 2012    no evidence of macroangiopathy  . GERD (gastroesophageal reflux disease)   . Glaucoma   . Chronic kidney disease     chronic kidney disease, stage II  . Benign esophageal stricture Oct 2013    dilated by Dr Candace Cruise   . Nonmelanoma skin cancer 11/06    precancerous actinic keratosis s/p liquid nitrogen, Dr. Nehemiah Massed  . breast cancer     Past Surgical History  Procedure Laterality Date  . Colon surgery    . Colon resection  2008    proximal due to precancerous polyp  . Abdominal hysterectomy   1962    due to hemorrhagic  . Appendectomy  1948  . Bladder suspension    . Breast surgery  nov 2013    core biopsy Dr Burt Knack       The following portions of the patient's history were reviewed and updated as appropriate: Allergies, current medications, and problem list.    Review of Systems:   12 Pt  review of systems was negative except those addressed in the HPI,     History   Social History  . Marital Status: Single    Spouse Name: N/A    Number of Children: N/A  . Years of Education: N/A   Occupational History  . Retired     Regulatory affairs officer, Agricultural consultant at [employer]  . Smoking status: Never Smoker   . Smokeless tobacco: Never Used  . Alcohol Use: No  . Drug Use: No  . Sexual Activity: No   Other Topics Concern  . Not on file   Social History Narrative  . No narrative on file    Objective:  Filed Vitals:   06/27/13 1432  BP: 126/54  Pulse: 64  Temp: 98 F (36.7 C)  Resp: 14     General appearance: alert, cooperative and appears  stated age Ears: normal TM's and external ear canals both ears Throat: lips, mucosa, and tongue normal; teeth and gums normal Neck: no adenopathy, no carotid bruit, supple, symmetrical, trachea midline and thyroid not enlarged, symmetric, no tenderness/mass/nodules Back: symmetric, no curvature. ROM normal. No CVA tenderness. Lungs: clear to auscultation bilaterally Heart: regular rate and rhythm, S1, S2 normal, no murmur, click, rub or gallop Abdomen: soft, non-tender; bowel sounds normal; no masses,  no organomegaly Pulses: 2+ and symmetric Skin: Skin color, texture, turgor normal. No rashes or lesions Lymph nodes: Cervical, supraclavicular, and axillary nodes normal.  Assessment and Plan:  Viral URI with cough This URI is most likely viral given themild HEENT  symptomsand normal exam. I  have explained that in viral URIS, an antibiotic will not help the symptoms and will increase the  risk of developing diarrhea.,  Continue oral and nasal decongestants,  Ibuprofen 400 mg and tylenol 650 mq 8 hrs for aches and pains,  And will addtessalon 100 mg every 8 hours prn cough     Updated Medication List Outpatient Encounter Prescriptions as of 06/27/2013  Medication Sig  . acetaminophen (TYLENOL) 325 MG tablet Take 650 mg by mouth every 6 (six) hours as needed.  Marland Kitchen aspirin 81 MG tablet Take 81 mg by mouth daily.    . calcium carbonate (OS-CAL) 600 MG TABS Take 600 mg by mouth 2 (two) times daily with a meal.    . clobetasol ointment (TEMOVATE) 0.05 % Apply topically 2 (two) times daily.  . diphenoxylate-atropine (LOMOTIL) 2.5-0.025 MG per tablet Take 1 tablet by mouth as needed for diarrhea or loose stools.  . DORZOLAMIDE HCL-TIMOLOL MAL OP Apply 1 drop to eye 2 (two) times daily. 1 drop into left eye twice daily  . enalapril (VASOTEC) 5 MG tablet Take 1 tablet (5 mg total) by mouth daily.  Marland Kitchen latanoprost (XALATAN) 0.005 % ophthalmic solution Place 1 drop into the left eye at bedtime.  Marland Kitchen nystatin-triamcinolone ointment (MYCOLOG) Apply topically 2 (two) times daily.  Marland Kitchen omeprazole (PRILOSEC) 20 MG capsule Take 20 mg by mouth daily.    . polyethylene glycol powder (GLYCOLAX/MIRALAX) powder   . potassium chloride (K-DUR) 10 MEQ tablet Take 10 mEq by mouth daily.  Marland Kitchen triamterene-hydrochlorothiazide (MAXZIDE-25) 37.5-25 MG per tablet Take 1 tablet by mouth daily.  . vitamin B-12 (CYANOCOBALAMIN) 100 MCG tablet Take 50 mcg by mouth daily.  . benzonatate (TESSALON) 100 MG capsule Take 1 capsule (100 mg total) by mouth 3 (three) times daily as needed for cough.  . magnesium oxide (MAG-OX) 400 MG tablet Take 400 mg by mouth 2 (two) times daily.     No orders of the defined types were placed in this encounter.    No Follow-up on file.

## 2013-06-28 NOTE — Assessment & Plan Note (Signed)
This URI is most likely viral given themild HEENT  symptomsand normal exam. I  have explained that in viral URIS, an antibiotic will not help the symptoms and will increase the risk of developing diarrhea.,  Continue oral and nasal decongestants,  Ibuprofen 400 mg and tylenol 650 mq 8 hrs for aches and pains,  And will addtessalon 100 mg every 8 hours prn cough

## 2013-07-01 ENCOUNTER — Telehealth: Payer: Self-pay | Admitting: Internal Medicine

## 2013-07-01 ENCOUNTER — Ambulatory Visit (INDEPENDENT_AMBULATORY_CARE_PROVIDER_SITE_OTHER): Payer: Medicare HMO | Admitting: Internal Medicine

## 2013-07-01 ENCOUNTER — Encounter: Payer: Self-pay | Admitting: Internal Medicine

## 2013-07-01 ENCOUNTER — Ambulatory Visit (INDEPENDENT_AMBULATORY_CARE_PROVIDER_SITE_OTHER)
Admission: RE | Admit: 2013-07-01 | Discharge: 2013-07-01 | Disposition: A | Discharge status: Home / Self Care | Payer: Medicare HMO | Source: Ambulatory Visit | Attending: Internal Medicine | Admitting: Internal Medicine

## 2013-07-01 VITALS — BP 144/68 | HR 82 | Temp 98.4°F | Resp 20 | Wt 115.5 lb

## 2013-07-01 DIAGNOSIS — J209 Acute bronchitis, unspecified: Secondary | ICD-10-CM

## 2013-07-01 DIAGNOSIS — J189 Pneumonia, unspecified organism: Secondary | ICD-10-CM

## 2013-07-01 MED ORDER — LEVOFLOXACIN 500 MG PO TABS: 500.0000 mg | ORAL_TABLET | Freq: Every day | ORAL | Status: DC

## 2013-07-01 MED ORDER — PREDNISONE (PAK) 10 MG PO TABS: ORAL_TABLET | ORAL | Status: DC

## 2013-07-01 NOTE — Telephone Encounter (Signed)
FYI

## 2013-07-01 NOTE — Progress Notes (Signed)
Pre-visit discussion using our clinic review tool. No additional management support is needed unless otherwise documented below in the visit note.  

## 2013-07-01 NOTE — Telephone Encounter (Signed)
Patient Information: ° Caller Name: Deshauna ° Phone: [phone] ° Patient: Rachel Reynolds, Rachel Reynolds ° Gender: Female ° DOB: 11/12/1926 ° Age: 80 Years ° PCP: Tullo, Teresa (Adults only) ° °Office Follow Up: ° Does the office need to follow up with this patient?: No ° Instructions For The Office: N/A ° ° °Symptoms ° Reason For Call & Symptoms: 06/24/13 cough onset, 06/27/13 seen by Dr Tullo.  Given cough medication and following instructions but no improvement.  07/01/13 constant cough worse at night, soreness of left chest with cough only and has tenderness to touch at all times, cough is worse than when seen, unsure if febrile - no thermometer available, hoarseness.   Advised pt of need for appt, care advice given and advised pt to call back if she has any chest pain occurring WITHOUT coughing.   Pt agreed.  Appt made 1345 with Dr Tullo on 07/01/13. ° Reviewed Health History In EMR: Yes ° Reviewed Medications In EMR: Yes ° Reviewed Allergies In EMR: Yes ° Reviewed Surgeries / Procedures: Yes ° Date of Onset of Symptoms: 06/24/2013 ° Treatments Tried: cough medication, Tylenol ° Treatments Tried Worked: No ° °Guideline(s) Used: ° Cough ° °Disposition Per Guideline:  ° See Today in Office ° °Reason For Disposition Reached:  ° Severe coughing spells (e.g., whooping sound after coughing, vomiting after coughing) ° °Advice Given: ° Prevent Dehydration: ° Drink adequate liquids. ° This will help soothe an irritated or dry throat and loosen up the phlegm. ° Call Back If: ° Difficulty breathing ° You become worse. ° °Patient Will Follow Care Advice: ° YES ° °Appointment Scheduled: ° 07/01/2013 13:45:00 °Appointment Scheduled Provider: ° Tullo, Teresa (Adults only) ° ° °

## 2013-07-01 NOTE — Progress Notes (Signed)
Patient ID: [name], female   DOB: 06/10/1927, 80 y.o.   MRN: [id number]   Patient Active Problem List   Diagnosis Date Noted  . Pneumonia involving right lung 06/27/2013  . Hematuria, gross 04/12/2013  . Excessive sleepiness 12/10/2012  . Chronic vulvitis 11/16/2012  . Edema 08/08/2012  . Coronary artery disease 04/23/2012  . Benign esophageal stricture   . Invasive lobular carcinoma of breast, stage 1 02/14/2012  . Kidney disease, chronic, stage III (GFR 30-59 ml/min) 02/14/2012  . Cognitive complaints with normal neuropsychological exam 02/04/2012  . Diarrhea 01/23/2012  . Dysesthesia 08/19/2011  . History of MRI of brain and brain stem   . Hypertension 08/10/2011  . Anemia 02/03/2011    Subjective:  CC:   Chief Complaint  Patient presents with  . Cough    hurts through left  breast and shoulder when she coughs    HPI:   Rachel Reynolds a 80 y.o. female who presents for followup on persistent non-productive cough. Patient was seen several days ago and treated for viral upper respiratory infection with cough suppressants. She states that the cough has not improved and she is now having pain in her left chest when she coughs. She denies fevers and productive sputum. No diarrhea myalgias or malaise. She's accompanied by her son Rachel Reynolds today.     Past Medical History  Diagnosis Date  . Hypertension   . Vaginal delivery     4  . History of MRI of brain and brain stem Nov 2012    no evidence of macroangiopathy  . GERD (gastroesophageal reflux disease)   . Glaucoma   . Chronic kidney disease     chronic kidney disease, stage II  . Benign esophageal stricture Oct 2013    dilated by Dr Rachel Reynolds   . Nonmelanoma skin cancer 11/06    precancerous actinic keratosis s/p liquid nitrogen, Dr. Nehemiah Reynolds  . breast cancer     Past Surgical History  Procedure Laterality Date  . Colon surgery    . Colon resection  2008    proximal due to precancerous polyp  . Abdominal  hysterectomy  1962    due to hemorrhagic  . Appendectomy  1948  . Bladder suspension    . Breast surgery  nov 2013    core biopsy Dr Rachel Reynolds       The following portions of the patient's history were reviewed and updated as appropriate: Allergies, current medications, and problem list.    Review of Systems:   12 Pt  review of systems was negative except those addressed in the HPI,     History   Social History  . Marital Status: Single    Spouse Name: N/A    Number of Children: N/A  . Years of Education: N/A   Occupational History  . Retired     Regulatory affairs officer, Agricultural consultant at [employer]  . Smoking status: Never Smoker   . Smokeless tobacco: Never Used  . Alcohol Use: No  . Drug Use: No  . Sexual Activity: No   Other Topics Concern  . Not on file   Social History Narrative  . No narrative on file    Objective:  Filed Vitals:   07/01/13 1405  BP: 144/68  Pulse: 82  Temp: 98.4 F (36.9 C)  Resp: 20     General appearance: alert, cooperative and appears stated age Ears: normal TM's and external ear canals both ears Throat: lips,  mucosa, and tongue normal; teeth and gums normal Neck: no adenopathy, no carotid bruit, supple, symmetrical, trachea midline and thyroid not enlarged, symmetric, no tenderness/mass/nodules Back: symmetric, no curvature. ROM normal. No CVA tenderness. Lungs: clear to auscultation bilaterally Heart: regular rate and rhythm, S1, S2 normal, no murmur, click, rub or gallop Abdomen: soft, non-tender; bowel sounds normal; no masses,  no organomegaly Pulses: 2+ and symmetric Skin: Skin color, texture, turgor normal. No rashes or lesions Lymph nodes: Cervical, supraclavicular, and axillary nodes normal.  Assessment and Plan:  Pneumonia involving right lung She was sent for chest x-ray given her new onset pain in the left lung with cough. PA and lateral chest x-ray showed patchy airspace disease in the right  lower lobe consistent with early pneumonia. Levaquin and prednisone  prescribed. Followup next week with Dr. Derrel Reynolds   Updated Medication List Outpatient Encounter Prescriptions as of 07/01/2013  Medication Sig  . acetaminophen (TYLENOL) 325 MG tablet Take 650 mg by mouth every 6 (six) hours as needed.  Marland Kitchen aspirin 81 MG tablet Take 81 mg by mouth daily.    . benzonatate (TESSALON) 100 MG capsule Take 1 capsule (100 mg total) by mouth 3 (three) times daily as needed for cough.  . calcium carbonate (OS-CAL) 600 MG TABS Take 600 mg by mouth 2 (two) times daily with a meal.    . clobetasol ointment (TEMOVATE) 0.05 % Apply topically 2 (two) times daily.  . diphenoxylate-atropine (LOMOTIL) 2.5-0.025 MG per tablet Take 1 tablet by mouth as needed for diarrhea or loose stools.  . DORZOLAMIDE HCL-TIMOLOL MAL OP Apply 1 drop to eye 2 (two) times daily. 1 drop into left eye twice daily  . enalapril (VASOTEC) 5 MG tablet Take 1 tablet (5 mg total) by mouth daily.  Marland Kitchen latanoprost (XALATAN) 0.005 % ophthalmic solution Place 1 drop into the left eye at bedtime.  . magnesium oxide (MAG-OX) 400 MG tablet Take 400 mg by mouth 2 (two) times daily.  Marland Kitchen nystatin-triamcinolone ointment (MYCOLOG) Apply topically 2 (two) times daily.  Marland Kitchen omeprazole (PRILOSEC) 20 MG capsule Take 20 mg by mouth daily.    . polyethylene glycol powder (GLYCOLAX/MIRALAX) powder   . potassium chloride (K-DUR) 10 MEQ tablet Take 10 mEq by mouth daily.  Marland Kitchen triamterene-hydrochlorothiazide (MAXZIDE-25) 37.5-25 MG per tablet Take 1 tablet by mouth daily.  . vitamin B-12 (CYANOCOBALAMIN) 100 MCG tablet Take 50 mcg by mouth daily.  Marland Kitchen levofloxacin (LEVAQUIN) 500 MG tablet Take 1 tablet (500 mg total) by mouth daily.  . predniSONE (STERAPRED UNI-PAK) 10 MG tablet 6 tablets on Day 1 , then reduce by 1 tablet daily until gone     Orders Placed This Encounter  Procedures  . DG Chest 2 View    No Follow-up on file.

## 2013-07-01 NOTE — Patient Instructions (Addendum)
I am sending you to [facility] for  A chest x ray to rule out pneumonia  If the chest  Xray is normal,.  You do not need to start the antibiotic  Continue using the  benzonatate capsules to quiet down the cough ,  And a prednisone taper to start tomorrow to help with the inflammation

## 2013-07-01 NOTE — Telephone Encounter (Signed)
Patient Information:  Caller Name: Birttany  Phone: [phone]  Patient: Rachel Reynolds, Rachel Reynolds  Gender: Female  DOB: April 18, 1927  Age: 80 Years  PCP: Deborra Medina (Adults only)  Office Follow Up:  Does the office need to follow up with this patient?: No  Instructions For The Office: N/A   Symptoms  Reason For Call & Symptoms: 06/24/13 cough onset, 06/27/13 seen by Dr Derrel Nip.  Given cough medication and following instructions but no improvement.  07/01/13 constant cough worse at night, soreness of left chest with cough only and has tenderness to touch at all times, cough is worse than when seen, unsure if febrile - no thermometer available, hoarseness.   Advised pt of need for appt, care advice given and advised pt to call back if she has any chest pain occurring WITHOUT coughing.   Pt agreed.  Appt made 6010 with Dr Derrel Nip on 07/01/13.  Reviewed Health History In EMR: Yes  Reviewed Medications In EMR: Yes  Reviewed Allergies In EMR: Yes  Reviewed Surgeries / Procedures: Yes  Date of Onset of Symptoms: 06/24/2013  Treatments Tried: cough medication, Tylenol  Treatments Tried Worked: No  Guideline(s) Used:  Cough  Disposition Per Guideline:   See Today in Office  Reason For Disposition Reached:   Severe coughing spells (e.g., whooping sound after coughing, vomiting after coughing)  Advice Given:  Prevent Dehydration:  Drink adequate liquids.  This will help soothe an irritated or dry throat and loosen up the phlegm.  Call Back If:  Difficulty breathing  You become worse.  Patient Will Follow Care Advice:  YES  Appointment Scheduled:  07/01/2013 13:45:00 Appointment Scheduled Provider:  Deborra Medina (Adults only)

## 2013-07-03 ENCOUNTER — Encounter: Payer: Self-pay | Admitting: Internal Medicine

## 2013-07-03 NOTE — Assessment & Plan Note (Signed)
She was sent for chest x-ray given her new onset pain in the left lung with cough. PA and lateral chest x-ray showed patchy airspace disease in the right lower lobe consistent with early pneumonia. Levaquin and prednisone  prescribed. Followup next week with Dr. Derrel Nip

## 2013-07-08 ENCOUNTER — Other Ambulatory Visit: Payer: Self-pay | Admitting: Internal Medicine

## 2013-07-09 ENCOUNTER — Emergency Department: Payer: Self-pay | Admitting: Emergency Medicine

## 2013-07-09 LAB — COMPREHENSIVE METABOLIC PANEL
ALBUMIN: 3.5 g/dL (ref 3.4–5.0)
ALK PHOS: 92 U/L
ALT: 26 U/L (ref 12–78)
Anion Gap: 5 — ABNORMAL LOW (ref 7–16)
BUN: 29 mg/dL — ABNORMAL HIGH (ref 7–18)
Bilirubin,Total: 0.3 mg/dL (ref 0.2–1.0)
CALCIUM: 9 mg/dL (ref 8.5–10.1)
Chloride: 100 mmol/L (ref 98–107)
Co2: 30 mmol/L (ref 21–32)
Creatinine: 1.57 mg/dL — ABNORMAL HIGH (ref 0.60–1.30)
GFR CALC AF AMER: 35 — AB
GFR CALC NON AF AMER: 30 — AB
Glucose: 106 mg/dL — ABNORMAL HIGH (ref 65–99)
Osmolality: 276 (ref 275–301)
Potassium: 3.3 mmol/L — ABNORMAL LOW (ref 3.5–5.1)
SGOT(AST): 24 U/L (ref 15–37)
SODIUM: 135 mmol/L — AB (ref 136–145)
Total Protein: 6.9 g/dL (ref 6.4–8.2)

## 2013-07-09 LAB — CBC
HCT: 34.7 % — ABNORMAL LOW (ref 35.0–47.0)
HGB: 11.5 g/dL — ABNORMAL LOW (ref 12.0–16.0)
MCH: 30.5 pg (ref 26.0–34.0)
MCHC: 33.2 g/dL (ref 32.0–36.0)
MCV: 92 fL (ref 80–100)
PLATELETS: 223 10*3/uL (ref 150–440)
RBC: 3.77 10*6/uL — AB (ref 3.80–5.20)
RDW: 12.6 % (ref 11.5–14.5)
WBC: 11.6 10*3/uL — ABNORMAL HIGH (ref 3.6–11.0)

## 2013-07-09 LAB — CK TOTAL AND CKMB (NOT AT ARMC)
CK, Total: 29 U/L (ref 21–215)
CK-MB: 0.6 ng/mL (ref 0.5–3.6)

## 2013-07-09 LAB — PROTIME-INR
INR: 1.1
Prothrombin Time: 14.5 secs (ref 11.5–14.7)

## 2013-07-09 LAB — TROPONIN I: Troponin-I: <0.02 ng/mL

## 2013-07-09 LAB — APTT: ACTIVATED PTT: 23.9 s (ref 23.6–35.9)

## 2013-07-11 ENCOUNTER — Encounter: Payer: Self-pay | Admitting: Internal Medicine

## 2013-07-11 ENCOUNTER — Ambulatory Visit (INDEPENDENT_AMBULATORY_CARE_PROVIDER_SITE_OTHER): Payer: Medicare HMO | Admitting: Internal Medicine

## 2013-07-11 ENCOUNTER — Telehealth: Payer: Self-pay | Admitting: Internal Medicine

## 2013-07-11 VITALS — BP 120/52 | HR 77 | Temp 98.3°F | Wt 116.0 lb

## 2013-07-11 DIAGNOSIS — M25519 Pain in unspecified shoulder: Secondary | ICD-10-CM

## 2013-07-11 DIAGNOSIS — M25512 Pain in left shoulder: Secondary | ICD-10-CM

## 2013-07-11 DIAGNOSIS — J189 Pneumonia, unspecified organism: Secondary | ICD-10-CM

## 2013-07-11 MED ORDER — TRAMADOL HCL 50 MG PO TABS: 50.0000 mg | ORAL_TABLET | Freq: Two times a day (BID) | ORAL | Status: DC | PRN

## 2013-07-11 NOTE — Assessment & Plan Note (Signed)
Recent RLL pneumonia. Exam normal today. Needs follow up imaging to confirm clearance approximately 08/26/2013. Follow up scheduled with Dr. Derrel Nip in 4 weeks and order for CXR placed.

## 2013-07-11 NOTE — Progress Notes (Signed)
Pre-visit discussion using our clinic review tool. No additional management support is needed unless otherwise documented below in the visit note.  

## 2013-07-11 NOTE — Telephone Encounter (Signed)
Patient Information:  Caller Name: Amariz  Phone: [phone]  Patient: Rachel Reynolds, Rachel Reynolds  Gender: Female  DOB: 1926/08/24  Age: 80 Years  PCP: Deborra Medina (Adults only)  Office Follow Up:  Does the office need to follow up with this patient?: No  Instructions For The Office: N/A   Symptoms  Reason For Call & Symptoms: Patient calling about pain in left elbow "like you play ball and over exert yourself"  Went to ED on 07/09/13 pm regarding same; she reports that she "was run through the mill".  She also related she has pneumonia since 07/01/13.  She states cough not as bad as previously.  Unable to complete triage due to phone connection was lost. Attempted to reconnect with busy signal multiple times.  Follow up scheduled.  Reviewed Health History In EMR: Yes  Reviewed Medications In EMR: Yes  Reviewed Allergies In EMR: Yes  Reviewed Surgeries / Procedures: Yes  Date of Onset of Symptoms: 07/09/2013  Treatments Tried: Heating pad with some relief  Treatments Tried Worked: Yes  Guideline(s) Used:  Arm Pain  No Protocol Available - Information Only  Disposition Per Guideline:   Discuss with PCP and Callback by Nurse within 1 Hour  Reason For Disposition Reached:   Nursing judgment  Advice Given:  N/A  RN Overrode Recommendation:  Document Patient  Follow up scheduled.  Patient Information:  Caller Name: Ingri  Phone: [phone]  Patient: Rachel, Reynolds  Gender: Female  DOB: 04-Jan-1927  Age: 44 Years  PCP: Deborra Medina (Adults only)  Office Follow Up:  Does the office need to follow up with this patient?: No  Instructions For The Office: N/A   Symptoms  Reason For Call & Symptoms: Patient calling about pain in left elbow "like you play ball and over exert yourself"  Went to ED on 07/09/13 pm regarding same; she reports that she "was run through the mill".  She also related she has pneumonia since 07/01/13.  She states cough not as bad as previously.   Unable to complete triage due to phone connection was lost. Attempted to reconnect with busy signal multiple times.  Follow up scheduled.  Reviewed Health History In EMR: Yes  Reviewed Medications In EMR: Yes  Reviewed Allergies In EMR: Yes  Reviewed Surgeries / Procedures: Yes  Date of Onset of Symptoms: 07/09/2013  Treatments Tried: Heating pad with some relief  Treatments Tried Worked: Yes  Guideline(s) Used:  Arm Pain  No Protocol Available - Information Only  Disposition Per Guideline:   Discuss with PCP and Callback by Nurse within 1 Hour  Reason For Disposition Reached:   Nursing judgment  Advice Given:  N/A  RN Overrode Recommendation:  Make Appointment  Follow up call related to pain in left arm; relates pain shoulder to wrist; sometimes from shoulder to  elbow area and other times from elbow to wrist; she has been using heating pad with some relief. She was evaluated in [place] ED 07/09/13 for same.  Pain rated at 7-8 of 10, intermittent.  It lasts 2-3 minutes and occurs mostly with movement.   Emergent symptoms ruled out.  See Provider within 24 hours per Arm Non-Injury protocol due to Severe pain with movement that limits activities.   Caller agreed.  Appointment Scheduled:  07/11/2013 13:00:00 Appointment Scheduled Provider:  Ronette Deter (Adults only)

## 2013-07-11 NOTE — Progress Notes (Signed)
Subjective:    Patient ID: Rachel Reynolds, female    DOB: 09-02-1926, 80 y.o.   MRN: [id number]  HPI 80YO female presents for acute visit for left arm pain. Last seen in clinic 07/01/2013 with cough, had CXR which showed RLL pneumonia. Treated with Levaquin and Prednisone taper. Completed Levaquin this Saturday. Cough has improved. Denies dyspnea. Developed left arm pain starting Saturday. Described as "claw" squeezing arm. Starts in shoulder, goes to wrist. No trauma to arm. Pt is right handed. Seen in [facility] ED this weekend. Evaluation reportedly normal including cardiac workup and xray of shoulder. Taking Tylenol 325mg  twice daily with no improvement in symptoms.  Outpatient Encounter Prescriptions as of 07/11/2013  Medication Sig  . acetaminophen (TYLENOL) 325 MG tablet Take 650 mg by mouth every 6 (six) hours as needed.  Marland Kitchen aspirin 81 MG tablet Take 81 mg by mouth daily.    . benzonatate (TESSALON) 100 MG capsule Take 1 capsule (100 mg total) by mouth 3 (three) times daily as needed for cough.  . calcium carbonate (OS-CAL) 600 MG TABS Take 600 mg by mouth 2 (two) times daily with a meal.    . clobetasol ointment (TEMOVATE) 0.05 % Apply topically 2 (two) times daily.  . diphenoxylate-atropine (LOMOTIL) 2.5-0.025 MG per tablet Take 1 tablet by mouth as needed for diarrhea or loose stools.  . DORZOLAMIDE HCL-TIMOLOL MAL OP Apply 1 drop to eye 2 (two) times daily. 1 drop into left eye twice daily  . enalapril (VASOTEC) 5 MG tablet TAKE ONE TABLET BY MOUTH EVERY DAY  . latanoprost (XALATAN) 0.005 % ophthalmic solution Place 1 drop into the left eye at bedtime.  Marland Kitchen nystatin-triamcinolone ointment (MYCOLOG) Apply topically 2 (two) times daily.  Marland Kitchen omeprazole (PRILOSEC) 20 MG capsule Take 20 mg by mouth daily.    . potassium chloride (K-DUR) 10 MEQ tablet Take 10 mEq by mouth daily.  . predniSONE (STERAPRED UNI-PAK) 10 MG tablet 6 tablets on Day 1 , then reduce by 1 tablet daily until gone  .  triamterene-hydrochlorothiazide (MAXZIDE-25) 37.5-25 MG per tablet TAKE ONE TABLET BY MOUTH EVERY DAY  . vitamin B-12 (CYANOCOBALAMIN) 100 MCG tablet Take 50 mcg by mouth daily.  . magnesium oxide (MAG-OX) 400 MG tablet Take 400 mg by mouth 2 (two) times daily.  . polyethylene glycol powder (GLYCOLAX/MIRALAX) powder   . [DISCONTINUED] levofloxacin (LEVAQUIN) 500 MG tablet Take 1 tablet (500 mg total) by mouth daily.    BP 120/52  Pulse 77  Temp(Src) 98.3 F (36.8 C) (Oral)  Wt 116 lb (52.617 kg)  SpO2 95%   Review of Systems  Constitutional: Negative for fever, chills and fatigue.  Respiratory: Positive for cough (occasional dry, improved compared to previous). Negative for shortness of breath.   Cardiovascular: Negative for chest pain and leg swelling.  Musculoskeletal: Positive for arthralgias and myalgias.  Skin: Negative for color change and wound.  Neurological: Positive for weakness. Negative for tremors.       Objective:   Physical Exam  Constitutional: She is oriented to person, place, and time. She appears well-developed and well-nourished. No distress.  HENT:  Head: Normocephalic and atraumatic.  Right Ear: External ear normal.  Left Ear: External ear normal.  Nose: Nose normal.  Mouth/Throat: Oropharynx is clear and moist.  Eyes: Conjunctivae are normal. Pupils are equal, round, and reactive to light. Right eye exhibits no discharge. Left eye exhibits no discharge. No scleral icterus.  Neck: Normal range of motion. Neck supple. No tracheal  deviation present. No thyromegaly present.  Cardiovascular: Normal rate, regular rhythm, normal heart sounds and intact distal pulses.  Exam reveals no gallop and no friction rub.   No murmur heard. Pulmonary/Chest: Effort normal and breath sounds normal. No accessory muscle usage. Not tachypneic. No respiratory distress. She has no decreased breath sounds. She has no wheezes. She has no rhonchi. She has no rales. She exhibits no  tenderness.  Musculoskeletal: She exhibits no edema.       Left shoulder: She exhibits decreased range of motion (limited by pain), tenderness, pain and decreased strength. She exhibits no bony tenderness and no swelling.       Left elbow: She exhibits decreased range of motion. She exhibits no swelling.       Left wrist: She exhibits decreased range of motion and tenderness. She exhibits no bony tenderness and no swelling.       Arms: Lymphadenopathy:    She has no cervical adenopathy.  Neurological: She is alert and oriented to person, place, and time. No cranial nerve deficit. She exhibits normal muscle tone. Coordination normal.  Skin: Skin is warm and dry. No rash noted. She is not diaphoretic. No erythema. No pallor.  Psychiatric: She has a normal mood and affect. Her behavior is normal. Judgment and thought content normal.          Assessment & Plan:

## 2013-07-11 NOTE — Assessment & Plan Note (Addendum)
Severe left shoulder and arm pain. No focal abnormalities on exam. Question if she may have tendonitis related to recent use of fluoroquinolone, however this is atypical location for this and would have to involve multiple tendons. No swelling to suggest DVT. Perfusion normal. Neurologically intact. Will set up sports medicine evaluation. Question if [place] might be helpful to further evaluate tendonitis. Will continue Tylenol and use Tramadol prn severe pain. Discussed with patient and her son today.  Over 71min of which >50% spent in face-to-face contact with patient discussing plan of care

## 2013-07-13 ENCOUNTER — Ambulatory Visit (INDEPENDENT_AMBULATORY_CARE_PROVIDER_SITE_OTHER)
Admission: RE | Admit: 2013-07-13 | Discharge: 2013-07-13 | Disposition: A | Discharge status: Home / Self Care | Payer: Medicare HMO | Source: Ambulatory Visit | Attending: Family Medicine | Admitting: Family Medicine

## 2013-07-13 ENCOUNTER — Ambulatory Visit (INDEPENDENT_AMBULATORY_CARE_PROVIDER_SITE_OTHER): Payer: Medicare HMO | Admitting: Family Medicine

## 2013-07-13 ENCOUNTER — Encounter: Payer: Self-pay | Admitting: Family Medicine

## 2013-07-13 ENCOUNTER — Other Ambulatory Visit (INDEPENDENT_AMBULATORY_CARE_PROVIDER_SITE_OTHER): Payer: Medicare HMO

## 2013-07-13 VITALS — BP 122/74 | HR 81 | Temp 98.0°F | Resp 16 | Wt 115.0 lb

## 2013-07-13 DIAGNOSIS — M19019 Primary osteoarthritis, unspecified shoulder: Secondary | ICD-10-CM

## 2013-07-13 DIAGNOSIS — M25519 Pain in unspecified shoulder: Secondary | ICD-10-CM

## 2013-07-13 DIAGNOSIS — M25512 Pain in left shoulder: Secondary | ICD-10-CM

## 2013-07-13 DIAGNOSIS — M12819 Other specific arthropathies, not elsewhere classified, unspecified shoulder: Secondary | ICD-10-CM

## 2013-07-13 DIAGNOSIS — M5412 Radiculopathy, cervical region: Secondary | ICD-10-CM | POA: Insufficient documentation

## 2013-07-13 NOTE — Progress Notes (Signed)
I'm seeing this patient by the request  of:  TULLO,TERESA, MD   CC: Left shoulder pain  HPI: Patient is a pleasant 80 year old female with significant comorbidities including history of breast cancer status post lumpectomy coming in with left shoulder pain. Patient states that she has a chronic pain in his left shoulder that is worse with any type of overhead activity. Patient states that it is a dull aching sensation that is 9/10 in severity that does not respond to Tylenol. Patient states that it feels better she does not use it but he can wake her up at night as well. Patient does not remember any true injury. Patient states that the pain seems to be radiating somewhat to her neck. Patient to can have the shoulder pain without the neck pain.   Past medical, surgical, family and social history reviewed. Medications reviewed all in the electronic medical record.   Review of Systems: No headache, visual changes, nausea, vomiting, diarrhea, constipation, dizziness, abdominal pain, skin rash, fevers, chills, night sweats, weight loss, swollen lymph nodes, body aches, joint swelling, muscle aches, chest pain, shortness of breath, mood changes.   Objective:    Blood pressure 122/74, pulse 81, temperature 98 F (36.7 C), temperature source Oral, resp. rate 16, weight 115 lb 0.6 oz (52.182 kg), SpO2 96.00%.   General: No apparent distress alert and oriented x3 mood and affect normal, dressed appropriately. Frail elderly lady, flat affect HEENT: Pupils equal, extraocular movements intact Respiratory: Patient's speak in full sentences and does not appear short of breath Cardiovascular: No lower extremity edema, non tender, no erythema Skin: Warm dry intact with no signs of infection or rash on extremities or on axial skeleton. Abdomen: Soft nontender Neuro: Cranial nerves II through XII are intact, neurovascularly intact in all extremities with 2+ DTRs and 2+ pulses. Lymph: No lymphadenopathy of  posterior or anterior cervical chain or axillae bilaterally.  Gait normal with good balance and coordination.  MSK: Non tender with full range of motion and good stability and symmetric strength and tone of elbows, wrist, hip, knee and ankles bilaterally.  Neck: Inspection shows increasing kyphosis No palpable stepoffs. Negative Spurling's maneuver. Limited range of motion with rotation as well as side bending bilaterally Grip strength and sensation normal in bilateral hands Strength good C4 to T1 distribution No sensory change to C4 to T1 Negative Hoffman sign bilaterally Reflexes normal Shoulder: Left Inspection reveals significant atrophy of the muscles bilaterally Palpation has generalized pain with no specific findings ROM passive range of motion shows patient is unable to raise arm past 90 and forward flexion or abduction. Rotator cuff strength is 2/5 compared to 4/5 on contralateral side  signs of impingement with positive Neer and Hawkin's tests, empty can sign. Speeds and Yergason's tests normal. No labral pathology noted with negative Obrien's, negative clunk and good stability. Normal scapular function observed. No painful arc and no drop arm sign. No apprehension sign  Procedure: Real-time Ultrasound Guided Injection of left glenohumeral joint Device: GE Logiq E  Ultrasound guided injection is preferred based studies that show increased duration, increased effect, greater accuracy, decreased procedural pain, increased response rate with ultrasound guided versus blind injection.  Verbal informed consent obtained.  Time-out conducted.  Noted no overlying erythema, induration, or other signs of local infection.  Skin prepped in a sterile fashion.  Local anesthesia: Topical Ethyl chloride.  With sterile technique and under real time ultrasound guidance:  Joint visualized.  23g 1  inch needle inserted posterior approach.  Pictures taken for needle placement. Patient did have  injection of 2 cc of 1% lidocaine, 2 cc of 0.5% Marcaine, and 1cc of Kenalog 40 mg/dL. Completed without difficulty  Pain immediately resolved suggesting accurate placement of the medication.  Advised to call if fevers/chills, erythema, induration, drainage, or persistent bleeding.  Images permanently stored and available for review in the ultrasound unit.  Impression: Technically successful ultrasound guided injection.       Impression and Recommendations:     This case required medical decision making of moderate complexity.

## 2013-07-13 NOTE — Progress Notes (Signed)
Pre-visit discussion using our clinic review tool. No additional management support is needed unless otherwise documented below in the visit note.  

## 2013-07-13 NOTE — Assessment & Plan Note (Signed)
X-rays were ordered reviewed and interpreted by me today. Patient does have severe osteoarthritic changes with degenerative disc disease at multiple levels worse at C6-7. Patient was given home exercise program and discussed over-the-counter medications a cane be beneficial. We will have to see outpatient response to conservative therapy and further medications may be necessary but due to patient's age and frailty we need to be very careful. We'll discuss again in 3 weeks' time.  Of note patient does have a flat affect and I do think there is a possibility for depression. We'll discuss with her maker provider.

## 2013-07-13 NOTE — Assessment & Plan Note (Signed)
Patient's differential includes an radiation from the neck as well as shoulder discomfort. On ultrasound patient does have severe osteoarthritic changes of the left shoulder. Patient was given an injection today with moderate improvement in her pain. Home exercise program was given and we discussed over-the-counter medications that can be beneficial. In addition to this we talked about protecting supplementation with her having atrophy of the musculature. Patient will try these interventions and come back again in 3 weeks. I do feel that some of her pain is likely coming from her cervical radiculitis as well. We may need to consider other neurologic medication such as Neurontin and consider epidurals the patient's pain is severe.

## 2013-07-13 NOTE — Patient Instructions (Signed)
Good to meet you both.  Try exercises most days of the week.  Ice 20 minutes at end of day can help Take tylenol 650 mg three times a day is the best evidence based medicine we have for arthritis.  Glucosamine sulfate 750mg  twice a day is a supplement that has been shown to help moderate to severe arthritis. Vitamin D 2000 IU daily Fish oil 2 grams daily.  Tumeric 500mg  twice daily.  Capsaicin topically up to four times a day may also help with pain. I can repeat injection every  3 months if needed Get xray downstairs  Water aerobics and cycling with low resistance are the best two types of exercise for arthritis. Come back and see me in 3 weeks.

## 2013-07-26 ENCOUNTER — Telehealth: Payer: Self-pay | Admitting: Emergency Medicine

## 2013-07-26 NOTE — Telephone Encounter (Signed)
Referral underway for [facility]

## 2013-08-03 ENCOUNTER — Ambulatory Visit: Payer: Medicare HMO | Admitting: Family Medicine

## 2013-10-05 ENCOUNTER — Other Ambulatory Visit: Payer: Self-pay | Admitting: Internal Medicine

## 2013-10-05 NOTE — Telephone Encounter (Signed)
After doing research it looks as if patient had this done on 03/28/12

## 2013-10-31 ENCOUNTER — Ambulatory Visit (INDEPENDENT_AMBULATORY_CARE_PROVIDER_SITE_OTHER): Payer: Medicare HMO | Admitting: Internal Medicine

## 2013-10-31 ENCOUNTER — Encounter: Payer: Self-pay | Admitting: Internal Medicine

## 2013-10-31 VITALS — BP 140/68 | HR 64 | Temp 98.1°F | Resp 14 | Wt 115.0 lb

## 2013-10-31 DIAGNOSIS — R609 Edema, unspecified: Secondary | ICD-10-CM

## 2013-10-31 DIAGNOSIS — M5412 Radiculopathy, cervical region: Secondary | ICD-10-CM

## 2013-10-31 DIAGNOSIS — M25551 Pain in right hip: Secondary | ICD-10-CM

## 2013-10-31 DIAGNOSIS — R1011 Right upper quadrant pain: Secondary | ICD-10-CM

## 2013-10-31 DIAGNOSIS — M25559 Pain in unspecified hip: Secondary | ICD-10-CM

## 2013-10-31 MED ORDER — FUROSEMIDE 20 MG PO TABS: 20.0000 mg | ORAL_TABLET | Freq: Every day | ORAL | Status: DC

## 2013-10-31 NOTE — Progress Notes (Signed)
Patient ID: Rachel Reynolds, female   DOB: 1926-06-28, 80 y.o.   MRN: [id number]   Patient Active Problem List   Diagnosis Date Noted  . Hip pain, right 11/01/2013  . Rotator cuff arthropathy 07/13/2013  . Cervical radiculitis 07/13/2013  . Left shoulder pain 07/11/2013  . Pneumonia involving right lung 06/27/2013  . Hematuria, gross 04/12/2013  . Excessive sleepiness 12/10/2012  . Chronic vulvitis 11/16/2012  . Edema 08/08/2012  . Coronary artery disease 04/23/2012  . Benign esophageal stricture   . Invasive lobular carcinoma of breast, stage 1 02/14/2012  . Kidney disease, chronic, stage III (GFR 30-59 ml/min) 02/14/2012  . Cognitive complaints with normal neuropsychological exam 02/04/2012  . Diarrhea 01/23/2012  . Dysesthesia 08/19/2011  . History of MRI of brain and brain stem   . Hypertension 08/10/2011  . Anemia 02/03/2011    Subjective:  CC:   Chief Complaint  Patient presents with  . Edema    Feet and legs swelling.    HPI:   Rachel Reynolds is a 80 y.o. female who presents for  Recent onset of swelling of both lower extremities,   started several days ago.    Accompanied by bilateral thighs aching all the time .  jhas tried taking tylenol which helps  Transiently , Cannot tolerate any stronger medications and has CKD so NSAIds are C/I.  Reports that on the right it hurts from the right hip all the way down to the knee and on the left leg all the way to the ankle. Using a cane for stability.  No falls. Sedentary ,  But sits in a recliner with feet elevated most of the time. Feels that elevation of legs does not resolve the edema.  Using maxzide daily  For hypertension.  Hasn't felt well since the pneumonia this past winter.  Neck pops a lot.     Has history of breast cancer,  Diagnosed last year.  Saw Dr Rochel Brome recently.    Past Medical History  Diagnosis Date  . Hypertension   . Vaginal delivery     4  . History of MRI of brain and brain stem Nov  2012    no evidence of macroangiopathy  . GERD (gastroesophageal reflux disease)   . Glaucoma   . Chronic kidney disease     chronic kidney disease, stage II  . Benign esophageal stricture Oct 2013    dilated by Dr Candace Cruise   . Nonmelanoma skin cancer 11/06    precancerous actinic keratosis s/p liquid nitrogen, Dr. Nehemiah Massed  . breast cancer     Past Surgical History  Procedure Laterality Date  . Colon surgery    . Colon resection  2008    proximal due to precancerous polyp  . Abdominal hysterectomy  1962    due to hemorrhagic  . Appendectomy  1948  . Bladder suspension    . Breast surgery  nov 2013    core biopsy Dr Burt Knack       The following portions of the patient's history were reviewed and updated as appropriate: Allergies, current medications, and problem list.    Review of Systems:   Patient denies headache, fevers, malaise, unintentional weight loss, skin rash, eye pain, sinus congestion and sinus pain, sore throat, dysphagia,  hemoptysis , cough, dyspnea, wheezing, chest pain, palpitations, orthopnea, edema, abdominal pain, nausea, melena, diarrhea, constipation, flank pain, dysuria, hematuria, urinary  Frequency, nocturia, numbness, tingling, seizures,  Focal weakness, Loss of consciousness,  Tremor, insomnia,  depression, anxiety, and suicidal ideation.     History   Social History  . Marital Status: Single    Spouse Name: N/A    Number of Children: N/A  . Years of Education: N/A   Occupational History  . Retired     Regulatory affairs officer, Agricultural consultant at [employer]  . Smoking status: Never Smoker   . Smokeless tobacco: Never Used  . Alcohol Use: No  . Drug Use: No  . Sexual Activity: No   Other Topics Concern  . Not on file   Social History Narrative  . No narrative on file    Objective:  Filed Vitals:   10/31/13 1805  BP: 140/68  Pulse: 64  Temp: 98.1 F (36.7 C)  Resp: 14     General appearance: alert, cooperative and  appears stated age Ears: normal TM's and external ear canals both ears Throat: lips, mucosa, and tongue normal; teeth and gums normal Neck: no adenopathy, no carotid bruit, supple, symmetrical, trachea midline and thyroid not enlarged, symmetric, no tenderness/mass/nodules Back: symmetric, no curvature. ROM normal. No CVA tenderness. Lungs: clear to auscultation bilaterally Heart: regular rate and rhythm, S1, S2 normal, no murmur, click, rub or gallop Abdomen: soft, non-tender; bowel sounds normal; no masses,  no organomegaly Pulses: 2+ and symmetric Skin: Skin color, texture, turgor normal. No rashes or lesions Lymph nodes: Cervical, supraclavicular, and axillary nodes normal.  Assessment and Plan:  Edema Mild today but per patient it is severe at times and accompanied by bilateral leg pain.  Lasxi give, but need to rule out IVC clot given current diagnosis of breast CA.  Dopller of ab/pelvis ordered.    Hip pain, right No history of fall and has full ROM.  Liley oa vs DJD.  Refer to [facility] for evaluation and likely a steroid in jection since she cannot tolerate NSAIDs or analgesics stronger than tylenol  Cervical radiculitis Chronic, due to OA and DJD by prior by Sports medicine evaluation.     Updated Medication List Outpatient Encounter Prescriptions as of 10/31/2013  Medication Sig  . acetaminophen (TYLENOL) 325 MG tablet Take 650 mg by mouth every 6 (six) hours as needed.  Marland Kitchen aspirin 81 MG tablet Take 81 mg by mouth daily.    . calcium carbonate (OS-CAL) 600 MG TABS Take 600 mg by mouth 2 (two) times daily with a meal.    . clobetasol ointment (TEMOVATE) 0.05 % Apply topically 2 (two) times daily.  . diphenoxylate-atropine (LOMOTIL) 2.5-0.025 MG per tablet Take 1 tablet by mouth as needed for diarrhea or loose stools.  . DORZOLAMIDE HCL-TIMOLOL MAL OP Apply 1 drop to eye 2 (two) times daily. 1 drop into left eye twice daily  . enalapril (VASOTEC) 5 MG tablet TAKE  ONE TABLET BY MOUTH EVERY DAY  . latanoprost (XALATAN) 0.005 % ophthalmic solution Place 1 drop into the left eye at bedtime.  . magnesium oxide (MAG-OX) 400 MG tablet Take 400 mg by mouth 2 (two) times daily.  Marland Kitchen omeprazole (PRILOSEC) 20 MG capsule Take 20 mg by mouth daily.    . potassium chloride (K-DUR) 10 MEQ tablet Take 10 mEq by mouth daily.  . vitamin B-12 (CYANOCOBALAMIN) 100 MCG tablet Take 50 mcg by mouth daily.  . [DISCONTINUED] triamterene-hydrochlorothiazide (MAXZIDE-25) 37.5-25 MG per tablet TAKE ONE TABLET BY MOUTH EVERY DAY  . benzonatate (TESSALON) 100 MG capsule Take 1 capsule (100 mg total) by mouth 3 (three) times daily as needed for  cough.  . furosemide (LASIX) 20 MG tablet Take 1 tablet (20 mg total) by mouth daily.  Marland Kitchen nystatin-triamcinolone ointment (MYCOLOG) Apply topically 2 (two) times daily.  . polyethylene glycol powder (GLYCOLAX/MIRALAX) powder   . [DISCONTINUED] traMADol (ULTRAM) 50 MG tablet Take 1 tablet (50 mg total) by mouth every 12 (twelve) hours as needed.     Orders Placed This Encounter  Procedures  . For home use only DME Other see comment  . [place] Art/Ven Flow Abd Pelv Doppler    No Follow-up on file.

## 2013-10-31 NOTE — Patient Instructions (Addendum)
I am referring you to Rachel Reynolds  For your right hip pain to see if he can give you a steroid injection for your pain   I am ordering an ultrasound of your abdomen and pelvis to make sure you don't have a blood clot  In your vena cava causing your fluid retention and leg pain   I am changing your diuretic from triamterene  To furosemide .  Take it once daily in the morning   for your swelling,    you need to resume wearing compression stockings on a daily basis to keep the fluid down

## 2013-10-31 NOTE — Progress Notes (Signed)
Pre-visit discussion using our clinic review tool. No additional management support is needed unless otherwise documented below in the visit note.  

## 2013-11-01 DIAGNOSIS — M25551 Pain in right hip: Secondary | ICD-10-CM | POA: Insufficient documentation

## 2013-11-01 NOTE — Assessment & Plan Note (Signed)
Mild today but per patient it is severe at times and accompanied by bilateral leg pain.  Lasxi give, but need to rule out IVC clot given current diagnosis of breast CA.  Dopller of ab/pelvis ordered.

## 2013-11-01 NOTE — Assessment & Plan Note (Signed)
No history of fall and has full ROM.  Liley oa vs DJD.  Refer to [facility] for evaluation and likely a steroid in jection since she cannot tolerate NSAIDs or analgesics stronger than tylenol

## 2013-11-01 NOTE — Assessment & Plan Note (Signed)
Chronic, due to OA and DJD by prior by Sports medicine evaluation.

## 2013-11-03 ENCOUNTER — Telehealth: Payer: Self-pay | Admitting: Internal Medicine

## 2013-11-03 NOTE — Telephone Encounter (Signed)
The patient was told at her office visit that a referral for Dr. Mack Guise would be schedule ,in order for the patient to have a cortisone shot.

## 2013-11-04 ENCOUNTER — Other Ambulatory Visit: Payer: Self-pay | Admitting: Internal Medicine

## 2013-11-04 DIAGNOSIS — M25559 Pain in unspecified hip: Secondary | ICD-10-CM

## 2013-11-11 NOTE — Telephone Encounter (Signed)
Tried calling patient no answer. I was going to let her know that the referral was in process.

## 2013-11-14 ENCOUNTER — Ambulatory Visit: Payer: Medicare HMO | Admitting: Internal Medicine

## 2013-11-22 ENCOUNTER — Encounter: Payer: Self-pay | Admitting: Internal Medicine

## 2013-11-22 ENCOUNTER — Ambulatory Visit (INDEPENDENT_AMBULATORY_CARE_PROVIDER_SITE_OTHER): Payer: Medicare HMO | Admitting: Internal Medicine

## 2013-11-22 VITALS — BP 124/54 | HR 64 | Temp 97.4°F | Resp 16 | Ht 60.0 in | Wt 117.5 lb

## 2013-11-22 DIAGNOSIS — E559 Vitamin D deficiency, unspecified: Secondary | ICD-10-CM

## 2013-11-22 DIAGNOSIS — N183 Chronic kidney disease, stage 3 unspecified: Secondary | ICD-10-CM

## 2013-11-22 DIAGNOSIS — R609 Edema, unspecified: Secondary | ICD-10-CM

## 2013-11-22 DIAGNOSIS — M25559 Pain in unspecified hip: Secondary | ICD-10-CM

## 2013-11-22 DIAGNOSIS — M25551 Pain in right hip: Secondary | ICD-10-CM

## 2013-11-22 LAB — COMPREHENSIVE METABOLIC PANEL
ALBUMIN: 3.9 g/dL (ref 3.5–5.2)
ALT: 14 U/L (ref 0–35)
AST: 21 U/L (ref 0–37)
Alkaline Phosphatase: 62 U/L (ref 39–117)
BUN: 24 mg/dL — ABNORMAL HIGH (ref 6–23)
CALCIUM: 9.6 mg/dL (ref 8.4–10.5)
CHLORIDE: 102 meq/L (ref 96–112)
CO2: 28 mEq/L (ref 19–32)
Creatinine, Ser: 1.1 mg/dL (ref 0.4–1.2)
GFR: 48.63 mL/min — AB (ref >60.00)
Glucose, Bld: 135 mg/dL — ABNORMAL HIGH (ref 70–99)
POTASSIUM: 4.1 meq/L (ref 3.5–5.1)
Sodium: 138 mEq/L (ref 135–145)
TOTAL PROTEIN: 6.3 g/dL (ref 6.0–8.3)
Total Bilirubin: 0.6 mg/dL (ref 0.2–1.2)

## 2013-11-22 LAB — CBC WITH DIFFERENTIAL/PLATELET
BASOS ABS: 0 10*3/uL (ref 0.0–0.1)
Basophils Relative: 0.3 % (ref 0.0–3.0)
EOS ABS: 0.1 10*3/uL (ref 0.0–0.7)
Eosinophils Relative: 2 % (ref 0.0–5.0)
HCT: 30.5 % — ABNORMAL LOW (ref 36.0–46.0)
Hemoglobin: 10.3 g/dL — ABNORMAL LOW (ref 12.0–15.0)
LYMPHS PCT: 28.4 % (ref 12.0–46.0)
Lymphs Abs: 1.3 10*3/uL (ref 0.7–4.0)
MCHC: 33.8 g/dL (ref 30.0–36.0)
MCV: 94.2 fl (ref 78.0–100.0)
MONOS PCT: 8.9 % (ref 3.0–12.0)
Monocytes Absolute: 0.4 10*3/uL (ref 0.1–1.0)
NEUTROS PCT: 60.4 % (ref 43.0–77.0)
Neutro Abs: 2.7 10*3/uL (ref 1.4–7.7)
PLATELETS: 180 10*3/uL (ref 150.0–400.0)
RBC: 3.23 Mil/uL — ABNORMAL LOW (ref 3.87–5.11)
RDW: 12.4 % (ref 11.5–15.5)
WBC: 4.6 10*3/uL (ref 4.0–10.5)

## 2013-11-22 LAB — VITAMIN D 25 HYDROXY (VIT D DEFICIENCY, FRACTURES): VITD: 46.32 ng/mL

## 2013-11-22 MED ORDER — TRAMADOL HCL 50 MG PO TABS: 50.0000 mg | ORAL_TABLET | Freq: Three times a day (TID) | ORAL | Status: DC | PRN

## 2013-11-22 MED ORDER — LIDOCAINE 5 % EX PTCH: 1.0000 | MEDICATED_PATCH | CUTANEOUS | Status: DC

## 2013-11-22 NOTE — Patient Instructions (Addendum)
You can  Continue your current fluid pill   You should elevate your legs whenever you are sitting  You missed your appt with Dr Mack Guise this morning at  [place].  We will reschedule you as soon as possible   You can increase the tylenol to 1000 mg  Three times daily  And wear the lidocaine patch on your right hip for 12 hours daily  If you pain is not controlled with these medications You can try the tramadol pain pill

## 2013-11-22 NOTE — Progress Notes (Signed)
Patient ID: Rachel Reynolds, female   DOB: [birth year], 80 y.o.   MRN: [id number]  Patient Active Problem List   Diagnosis Date Noted  . Hip pain, right 11/01/2013  . Rotator cuff arthropathy 07/13/2013  . Cervical radiculitis 07/13/2013  . Left shoulder pain 07/11/2013  . Pneumonia involving right lung 06/27/2013  . Hematuria, gross 04/12/2013  . Excessive sleepiness 12/10/2012  . Chronic vulvitis 11/16/2012  . Edema 08/08/2012  . Coronary artery disease 04/23/2012  . Benign esophageal stricture   . Invasive lobular carcinoma of breast, stage 1 02/14/2012  . Kidney disease, chronic, stage III (GFR 30-59 ml/min) 02/14/2012  . Cognitive complaints with normal neuropsychological exam 02/04/2012  . Diarrhea 01/23/2012  . Dysesthesia 08/19/2011  . History of MRI of brain and brain stem   . Hypertension 08/10/2011  . Anemia 02/03/2011    Subjective:  CC:   Chief Complaint  Patient presents with  . Follow-up    2 week , left lower leg edema and right hip pain,    HPI:   Rachel Reynolds is a 80 y.o. female who presents for Follow up on recent complaint of LE edema and aching of both legs. and moderate right hip pain  She was sent for an ultrasound/doppler of abd /pelvis doppler to rule out IVC clot given her history of Breast cancer.  The u/s was done at [facility] and was reportedly normal (no report available).  She changed her diuretic to furosemide as directed but after 2 days saw no improvement and resume maxzide.  She feels the swelling has improved on maxzide but recures daily., She is elevating her legs when sitting by using a recliner .  She was referred to Ortho for evaluation of hip,  And according to the chart, had an appt this morning at [facility] but patient states that she was not contacted and was unaware of appt .  She has been Using tylenol for hip pain   3 times daily 500 mg.  NSAIDS are relative C/I secondary to CKD and she is intolerant of narcotics.       Past Medical History  Diagnosis Date  . Hypertension   . Vaginal delivery     4  . History of MRI of brain and brain stem Nov 2012    no evidence of macroangiopathy  . GERD (gastroesophageal reflux disease)   . Glaucoma   . Chronic kidney disease     chronic kidney disease, stage II  . Benign esophageal stricture Oct 2013    dilated by Dr Candace Cruise   . Nonmelanoma skin cancer 11/06    precancerous actinic keratosis s/p liquid nitrogen, Dr. Nehemiah Massed  . breast cancer     Past Surgical History  Procedure Laterality Date  . Colon surgery    . Colon resection  2008    proximal due to precancerous polyp  . Abdominal hysterectomy  1962    due to hemorrhagic  . Appendectomy  1948  . Bladder suspension    . Breast surgery  nov 2013    core biopsy Dr Burt Knack       The following portions of the patient's history were reviewed and updated as appropriate: Allergies, current medications, and problem list.    Review of Systems:   Patient denies headache, fevers, malaise, unintentional weight loss, skin rash, eye pain, sinus congestion and sinus pain, sore throat, dysphagia,  hemoptysis , cough, dyspnea, wheezing, chest pain, palpitations, orthopnea, edema, abdominal pain, nausea, melena, diarrhea, constipation,  flank pain, dysuria, hematuria, urinary  Frequency, nocturia, numbness, tingling, seizures,  Focal weakness, Loss of consciousness,  Tremor, insomnia, depression, anxiety, and suicidal ideation.     History   Social History  . Marital Status: Single    Spouse Name: N/A    Number of Children: N/A  . Years of Education: N/A   Occupational History  . Retired     Regulatory affairs officer, Agricultural consultant at [employer]  . Smoking status: Never Smoker   . Smokeless tobacco: Never Used  . Alcohol Use: No  . Drug Use: No  . Sexual Activity: No   Other Topics Concern  . Not on file   Social History Narrative  . No narrative on file     Objective:  Filed Vitals:   11/22/13 1356  BP: 124/54  Pulse: 64  Temp: 97.4 F (36.3 C)  Resp: 16     General appearance: alert, cooperative and appears stated age Ears: normal TM's and external ear canals both ears Throat: lips, mucosa, and tongue normal; teeth and gums normal Neck: no adenopathy, no carotid bruit, supple, symmetrical, trachea midline and thyroid not enlarged, symmetric, no tenderness/mass/nodules Back: symmetric, no curvature. ROM normal. No CVA tenderness. Lungs: clear to auscultation bilaterally Heart: regular rate and rhythm, S1, S2 normal, no murmur, click, rub or gallop Abdomen: soft, non-tender; bowel sounds normal; no masses,  no organomegaly Pulses: 2+ and symmetric Skin: Skin color, texture, turgor normal. No rashes or lesions Lymph nodes: Cervical, supraclavicular, and axillary nodes normal. Ext:  Minimal nonpitting LE edema left > right MSK: pain with passive flexion and abduction of right hip    Assessment and Plan:  Kidney disease, chronic, stage III (GFR 30-59 ml/min) Stable,  Avoid NSAIDs and Septra.  Attempts to dc maxzide resulted in increased edema and patinet prefers to continue this diuretic instead of furosemide  Hip pain, right Given the limitations on oral meds due to CKD, age and intolerances,  I have again referred her to [facility] for evaluation and possible intrarticular steroid injection   Edema Likely venous insufficiency, mild.  Managed with maxzide.  Records from recent doppler of abd from [facility] not received yet   Updated Medication List Outpatient Encounter Prescriptions as of 11/22/2013  Medication Sig  . acetaminophen (TYLENOL) 325 MG tablet Take 650 mg by mouth every 6 (six) hours as needed.  Marland Kitchen aspirin 81 MG tablet Take 81 mg by mouth daily.    . calcium carbonate (OS-CAL) 600 MG TABS Take 600 mg by mouth 2 (two) times daily with a meal.    . clobetasol ointment (TEMOVATE) 0.05 % Apply topically 2 (two) times  daily.  . diphenoxylate-atropine (LOMOTIL) 2.5-0.025 MG per tablet Take 1 tablet by mouth as needed for diarrhea or loose stools.  . DORZOLAMIDE HCL-TIMOLOL MAL OP Apply 1 drop to eye 2 (two) times daily. 1 drop into left eye twice daily  . enalapril (VASOTEC) 5 MG tablet TAKE ONE TABLET BY MOUTH EVERY DAY  . latanoprost (XALATAN) 0.005 % ophthalmic solution Place 1 drop into the left eye at bedtime.  . magnesium oxide (MAG-OX) 400 MG tablet Take 400 mg by mouth 2 (two) times daily.  Marland Kitchen omeprazole (PRILOSEC) 20 MG capsule Take 20 mg by mouth daily.    . polyethylene glycol powder (GLYCOLAX/MIRALAX) powder   . potassium chloride (K-DUR) 10 MEQ tablet Take 10 mEq by mouth daily.  . vitamin B-12 (CYANOCOBALAMIN) 100 MCG tablet Take 50 mcg by  mouth daily.  . benzonatate (TESSALON) 100 MG capsule Take 1 capsule (100 mg total) by mouth 3 (three) times daily as needed for cough.  . furosemide (LASIX) 20 MG tablet Take 1 tablet (20 mg total) by mouth daily.  Marland Kitchen lidocaine (LIDODERM) 5 % Place 1 patch onto the skin daily. Remove & Discard patch within 12 hours or as directed by MD  . nystatin-triamcinolone ointment (MYCOLOG) Apply topically 2 (two) times daily.  . traMADol (ULTRAM) 50 MG tablet Take 1 tablet (50 mg total) by mouth every 8 (eight) hours as needed.     Orders Placed This Encounter  Procedures  . Comp Met (CMET)  . CBC with Differential  . Vit D  25 hydroxy (rtn osteoporosis monitoring)    Return in about 3 months (around 02/22/2014).

## 2013-11-24 ENCOUNTER — Telehealth: Payer: Self-pay | Admitting: Internal Medicine

## 2013-11-24 ENCOUNTER — Encounter: Payer: Self-pay | Admitting: Internal Medicine

## 2013-11-24 NOTE — Telephone Encounter (Signed)
Barnett Applebaum to fax over results today. FYI

## 2013-11-24 NOTE — Assessment & Plan Note (Signed)
Likely venous insufficiency, mild.  Managed with maxzide.  Records from recent doppler of abd from [facility] not received yet

## 2013-11-24 NOTE — Telephone Encounter (Signed)
Can you request a copy of the recent doppler/us that [facility] did on Rachel Reynolds?  I never received it.

## 2013-11-24 NOTE — Telephone Encounter (Signed)
No. There are no alternatives, because of patient's medical history and intolerances/

## 2013-11-24 NOTE — Telephone Encounter (Signed)
PA request form placed in Dr.Tullo in Box

## 2013-11-24 NOTE — Assessment & Plan Note (Addendum)
Stable,  Avoid NSAIDs and Septra.  Attempts to dc maxzide resulted in increased edema and patinet prefers to continue this diuretic instead of furosemide

## 2013-11-24 NOTE — Telephone Encounter (Signed)
Patient requires PA for [facility] derm patch,  Do we need to try an alternative.

## 2013-11-24 NOTE — Telephone Encounter (Signed)
States pt has been waiting for preauthorization for medication, lidocaine patch.  States he has left messages with no response.  States she needs this urgently and the call needs to be made to get preauthorization from [facility]  [facility].

## 2013-11-24 NOTE — Assessment & Plan Note (Signed)
Given the limitations on oral meds due to CKD, age and intolerances,  I have again referred her to [facility] for evaluation and possible intrarticular steroid injection

## 2013-11-28 NOTE — Telephone Encounter (Signed)
PA in red folder. 

## 2013-12-01 DIAGNOSIS — Z0279 Encounter for issue of other medical certificate: Secondary | ICD-10-CM

## 2014-01-05 ENCOUNTER — Other Ambulatory Visit: Payer: Self-pay | Admitting: Internal Medicine

## 2014-01-09 ENCOUNTER — Other Ambulatory Visit: Payer: Self-pay | Admitting: Internal Medicine

## 2014-01-09 NOTE — Telephone Encounter (Signed)
Rx faxed to [facility]

## 2014-01-09 NOTE — Telephone Encounter (Signed)
Ok refill? 

## 2014-01-09 NOTE — Telephone Encounter (Signed)
Ok to refill,  Refill sent  

## 2014-01-11 ENCOUNTER — Ambulatory Visit: Payer: Self-pay | Admitting: Orthopedic Surgery

## 2014-02-13 NOTE — Telephone Encounter (Signed)
Per [employer] portal and referral patient was seen seen and  [place] status approved  [id number] Exp 05/31/14 6 visits

## 2014-03-01 ENCOUNTER — Ambulatory Visit (INDEPENDENT_AMBULATORY_CARE_PROVIDER_SITE_OTHER): Payer: Medicare HMO | Admitting: Family Medicine

## 2014-03-01 ENCOUNTER — Ambulatory Visit: Payer: Self-pay | Admitting: Surgery

## 2014-03-01 ENCOUNTER — Encounter: Payer: Self-pay | Admitting: Family Medicine

## 2014-03-01 VITALS — BP 124/70 | HR 66 | Temp 97.9°F | Wt 114.8 lb

## 2014-03-01 DIAGNOSIS — N3000 Acute cystitis without hematuria: Secondary | ICD-10-CM

## 2014-03-01 DIAGNOSIS — R300 Dysuria: Secondary | ICD-10-CM

## 2014-03-01 LAB — POCT URINALYSIS DIPSTICK
Bilirubin, UA: NEGATIVE
Blood, UA: NEGATIVE
Glucose, UA: NEGATIVE
KETONES UA: NEGATIVE
LEUKOCYTES UA: NEGATIVE
Nitrite, UA: NEGATIVE
PH UA: 7.5
Protein, UA: 0.15
Spec Grav, UA: 1.01
UROBILINOGEN UA: 0.2

## 2014-03-01 MED ORDER — CIPROFLOXACIN HCL 250 MG PO TABS: 250.0000 mg | ORAL_TABLET | Freq: Two times a day (BID) | ORAL | Status: DC

## 2014-03-01 NOTE — Patient Instructions (Signed)
Drink plenty of water and start the antibiotics today.  We'll contact you with your lab report.  Take care.   

## 2014-03-01 NOTE — Progress Notes (Signed)
Pre visit review using our clinic review tool, if applicable. No additional management support is needed unless otherwise documented below in the visit note.  Dysuria: yes, burning with urination.   duration of symptoms: noted off/on for the last week.   abdominal pain:lower abd pain fevers:no back pain:minimal Vomiting:no No SOB, BP, BLE edema.   Meds, vitals, and allergies reviewed.   ROS: See HPI.  Otherwise negative.    GEN: nad, alert and oriented HEENT: mucous membranes moist NECK: supple CV: rrr.  PULM: ctab, no inc wob ABD: soft, +bs, suprapubic area mildly tender EXT: no edema SKIN: no acute rash BACK: no CVA pain

## 2014-03-01 NOTE — Assessment & Plan Note (Signed)
Presumed, check ucx.  Start cipro. Fluids, f/u prn.  D/w pt.  She agrees.

## 2014-03-03 LAB — URINE CULTURE: Colony Count: 70000

## 2014-03-06 ENCOUNTER — Other Ambulatory Visit: Payer: Self-pay | Admitting: Family Medicine

## 2014-03-06 MED ORDER — CIPROFLOXACIN HCL 250 MG PO TABS: 250.0000 mg | ORAL_TABLET | Freq: Two times a day (BID) | ORAL | Status: DC

## 2014-03-09 ENCOUNTER — Telehealth: Payer: Self-pay | Admitting: Internal Medicine

## 2014-03-09 NOTE — Telephone Encounter (Signed)
Patient Information:  Caller Name: Rachel Reynolds  Phone: [phone]  Patient: Rachel Reynolds, Rachel Reynolds  Gender: Female  DOB: [birth year]  Age: 80 Years  PCP: Deborra Medina (Adults only)  Office Follow Up:  Does the office need to follow up with this patient?: Yes  Instructions For The Office: Pt needs to be seen today prefers Dr. Derrel Nip and cannot get to another office.   Symptoms  Reason For Call & Symptoms: Pt is having burning with urination.  Pt started Cipro 250mg  1 PO BID x 8 pills 03/01/14 and 03/06/14, but states she is still  problems of buring and pain from her umbilicus down.  Not able to rate pain and only has burning with urination.  States it is " a little bit better" on the Cipro.  Reviewed Health History In EMR: Yes  Reviewed Medications In EMR: Yes  Reviewed Allergies In EMR: Yes  Reviewed Surgeries / Procedures: No  Date of Onset of Symptoms: 02/23/2014  Treatments Tried: Cipro 250 mg  Treatments Tried Worked: No  Guideline(s) Used:  Urination Pain - Female  Disposition Per Guideline:   See Today in Office  Reason For Disposition Reached:   Taking antibiotic > 3 days for UTI and painful urination not improved  Advice Given:  N/A  Patient Will Follow Care Advice:  YES

## 2014-03-09 NOTE — Telephone Encounter (Addendum)
Spoke with pt she states symptoms are some better, however she is just sore now.  She wants to see only Dr Derrel Nip.   Please advise

## 2014-03-10 ENCOUNTER — Ambulatory Visit (INDEPENDENT_AMBULATORY_CARE_PROVIDER_SITE_OTHER): Payer: Medicare HMO | Admitting: Internal Medicine

## 2014-03-10 ENCOUNTER — Encounter: Payer: Self-pay | Admitting: Internal Medicine

## 2014-03-10 VITALS — BP 118/68 | HR 68 | Temp 97.9°F | Resp 14 | Ht 60.0 in | Wt 115.0 lb

## 2014-03-10 DIAGNOSIS — R109 Unspecified abdominal pain: Secondary | ICD-10-CM

## 2014-03-10 DIAGNOSIS — R300 Dysuria: Secondary | ICD-10-CM

## 2014-03-10 LAB — POCT URINALYSIS DIPSTICK
Bilirubin, UA: NEGATIVE
Blood, UA: NEGATIVE
GLUCOSE UA: NEGATIVE
Ketones, UA: NEGATIVE
Leukocytes, UA: NEGATIVE
NITRITE UA: NEGATIVE
PROTEIN UA: NEGATIVE
SPEC GRAV UA: 1.01
Urobilinogen, UA: 0.2
pH, UA: 7

## 2014-03-10 MED ORDER — PHENAZOPYRIDINE HCL 200 MG PO TABS: 200.0000 mg | ORAL_TABLET | Freq: Three times a day (TID) | ORAL | Status: DC | PRN

## 2014-03-10 MED ORDER — ESTRADIOL 0.1 MG/GM VA CREA: 1.0000 g | TOPICAL_CREAM | Freq: Every day | VAGINAL | Status: DC

## 2014-03-10 MED ORDER — TRIAMCINOLONE ACETONIDE 0.1 % EX CREA
1.0000 | TOPICAL_CREAM | Freq: Two times a day (BID) | CUTANEOUS | Status: AC
Start: 2014-03-10 — End: ?

## 2014-03-10 NOTE — Progress Notes (Signed)
Patient ID: Rachel Reynolds, female   DOB: 25-Feb-1927, 80 y.o.   MRN: [id number]   Patient Active Problem List   Diagnosis Date Noted  . Abdominal pain, unspecified site 03/12/2014  . Dysuria 03/01/2014  . Hip pain, right 11/01/2013  . Rotator cuff arthropathy 07/13/2013  . Cervical radiculitis 07/13/2013  . Left shoulder pain 07/11/2013  . Pneumonia involving right lung 06/27/2013  . Hematuria, gross 04/12/2013  . Excessive sleepiness 12/10/2012  . Chronic vulvitis 11/16/2012  . Edema 08/08/2012  . Coronary artery disease 04/23/2012  . Benign esophageal stricture   . Invasive lobular carcinoma of breast, stage 1 02/14/2012  . Kidney disease, chronic, stage III (GFR 30-59 ml/min) 02/14/2012  . Cognitive complaints with normal neuropsychological exam 02/04/2012  . Diarrhea 01/23/2012  . Dysesthesia 08/19/2011  . History of MRI of brain and brain stem   . Hypertension 08/10/2011  . Anemia 02/03/2011    Subjective:  CC:   Chief Complaint  Patient presents with  . Urinary Tract Infection    Patient just finished cipro today for uti but still has dysuria.  . Abdominal Pain    hypogastric ,supra pubic pain.    HPI:   Rachel Reynolds is a 80 y.o. female who presents for Persistent dysuria and suprapubic pain despite negative UA x 2 and negative urine culture x 1   Currently reports burning with urination and abdominal "soreness" from her navel all the way down"  , almost like i've been hit or bruised"  No appetite loss, diarrhea,  Nausea , or back pain other than her chronic back pain .   She is not sexually active, but wears a pessary that is replaced every 3 months by GYN .    She underwent lumpectomy for breast cancer  In Nov 2013. but has deferred any additional treatment due to her age.     Past Medical History  Diagnosis Date  . Hypertension   . Vaginal delivery     4  . History of MRI of brain and brain stem Nov 2012    no evidence of macroangiopathy  .  GERD (gastroesophageal reflux disease)   . Glaucoma   . Chronic kidney disease     chronic kidney disease, stage II  . Benign esophageal stricture Oct 2013    dilated by Dr Candace Cruise   . Nonmelanoma skin cancer 11/06    precancerous actinic keratosis s/p liquid nitrogen, Dr. Nehemiah Massed  . breast cancer     Past Surgical History  Procedure Laterality Date  . Colon surgery    . Colon resection  2008    proximal due to precancerous polyp  . Abdominal hysterectomy  1962    due to hemorrhagic  . Appendectomy  1948  . Bladder suspension    . Breast surgery  nov 2013    core biopsy Dr Burt Knack       The following portions of the patient's history were reviewed and updated as appropriate: Allergies, current medications, and problem list.    Review of Systems:   Patient denies headache, fevers, malaise, unintentional weight loss, skin rash, eye pain, sinus congestion and sinus pain, sore throat, dysphagia,  hemoptysis , cough, dyspnea, wheezing, chest pain, palpitations, orthopnea, edema, abdominal pain, nausea, melena, diarrhea, constipation, flank pain, dysuria, hematuria, urinary  Frequency, nocturia, numbness, tingling, seizures,  Focal weakness, Loss of consciousness,  Tremor, insomnia, depression, anxiety, and suicidal ideation.     History   Social History  . Marital Status:  Single    Spouse Name: N/A    Number of Children: N/A  . Years of Education: N/A   Occupational History  . Retired     Regulatory affairs officer, Agricultural consultant at [employer]  . Smoking status: Never Smoker   . Smokeless tobacco: Never Used  . Alcohol Use: No  . Drug Use: No  . Sexual Activity: No   Other Topics Concern  . Not on file   Social History Narrative  . No narrative on file    Objective:  Filed Vitals:   03/10/14 1643  BP: 118/68  Pulse: 68  Temp: 97.9 F (36.6 C)  Resp: 14     General appearance: alert, cooperative and appears stated age Ears: normal TM's and  external ear canals both ears Throat: lips, mucosa, and tongue normal; teeth and gums normal Neck: no adenopathy, no carotid bruit, supple, symmetrical, trachea midline and thyroid not enlarged, symmetric, no tenderness/mass/nodules Back: symmetric, no curvature. ROM normal. No CVA tenderness. Lungs: clear to auscultation bilaterally Heart: regular rate and rhythm, S1, S2 normal, no murmur, click, rub or gallop Abdomen: soft, non-tender; bowel sounds normal; no masses,  no organomegaly Pulses: 2+ and symmetric Skin: Skin color, texture, turgor normal. No rashes or lesions Lymph nodes: Cervical, supraclavicular, and axillary nodes normal.  Assessment and Plan:  Dysuria She was treated empirically for UTi but UA and culture were negative. As it is again today.  Estrace titrial was discussed,  but too costly.  Trial of triamcinolone cream to urethra. If no improvement advised to see GYN to have pessary removed.   Abdominal pain, unspecified site Etiology unclear , in the absence of nausea , change in bowel habits, normal UA.  Exam is nontender.  Will follow for now and consider noncontrasted CT scan if persistent, given her history of breast cancer.     A total of 25 minutes of face to face time was spent with patient more than half of which was spent in counselling and coordination of care   Updated Medication List Outpatient Encounter Prescriptions as of 03/10/2014  Medication Sig  . acetaminophen (TYLENOL) 325 MG tablet Take 650 mg by mouth every 6 (six) hours as needed.  Marland Kitchen aspirin 81 MG tablet Take 81 mg by mouth daily.    . calcium carbonate (OS-CAL) 600 MG TABS Take 600 mg by mouth 2 (two) times daily with a meal.    . diphenoxylate-atropine (LOMOTIL) 2.5-0.025 MG per tablet TAKE 1 TABLET BY MOUTH 4 TIMES DAILY AS NEEDED FOR DIARRHEA OR LOOSE STOOLS.  . DORZOLAMIDE HCL-TIMOLOL MAL OP Apply 1 drop to eye 2 (two) times daily. 1 drop into left eye twice daily  . enalapril (VASOTEC) 5 MG  tablet TAKE ONE TABLET BY MOUTH EVERY DAY  . latanoprost (XALATAN) 0.005 % ophthalmic solution Place 1 drop into the left eye at bedtime.  Marland Kitchen omeprazole (PRILOSEC) 20 MG capsule Take 20 mg by mouth daily.    . potassium chloride (K-DUR) 10 MEQ tablet Take 10 mEq by mouth daily.  Marland Kitchen triamterene-hydrochlorothiazide (MAXZIDE-25) 37.5-25 MG per tablet Take 1 tablet by mouth daily.  . vitamin B-12 (CYANOCOBALAMIN) 100 MCG tablet Take 50 mcg by mouth daily.  . ciprofloxacin (CIPRO) 250 MG tablet Take 1 tablet (250 mg total) by mouth 2 (two) times daily.  . phenazopyridine (PYRIDIUM) 200 MG tablet Take 1 tablet (200 mg total) by mouth 3 (three) times daily as needed for pain.  Marland Kitchen triamcinolone cream (KENALOG)  0.1 % Apply 1 application topically 2 (two) times daily. To urethra  . [DISCONTINUED] estradiol (ESTRACE) 0.1 MG/GM vaginal cream Place 4.25 Applicatorfuls vaginally daily. For two weeks,  Then twice weekly thereafter     Orders Placed This Encounter  Procedures  . Urine Culture  . Urinalysis, Routine w reflex microscopic  . POCT Urinalysis Dipstick    No Follow-up on file.

## 2014-03-10 NOTE — Progress Notes (Signed)
Pre-visit discussion using our clinic review tool. No additional management support is needed unless otherwise documented below in the visit note.  

## 2014-03-10 NOTE — Patient Instructions (Addendum)
You do not have a urinary tract infection,   You didn't have one last week either  Your pain may be coming from vaginitis  I will prescribe an ointment you can apply to your urethr TWICE DAILY  TO MAANAE the burning feeling,  And you can take pyridium as well  If the pain is not better in 2 weeks ,  See Dr Enzo Bi

## 2014-03-10 NOTE — Telephone Encounter (Signed)
Spoke with pt, aware of 4pm appointment

## 2014-03-10 NOTE — Telephone Encounter (Signed)
Double book at 4:00

## 2014-03-11 LAB — URINALYSIS, ROUTINE W REFLEX MICROSCOPIC
Bilirubin Urine: NEGATIVE
GLUCOSE, UA: NEGATIVE mg/dL
Hgb urine dipstick: NEGATIVE
Ketones, ur: NEGATIVE mg/dL
LEUKOCYTES UA: NEGATIVE
NITRITE: NEGATIVE
PH: 7 (ref 5.0–8.0)
Protein, ur: NEGATIVE mg/dL
Specific Gravity, Urine: 1.009 (ref 1.005–1.030)
Urobilinogen, UA: 0.2 mg/dL (ref 0.0–1.0)

## 2014-03-12 DIAGNOSIS — R109 Unspecified abdominal pain: Secondary | ICD-10-CM | POA: Insufficient documentation

## 2014-03-12 LAB — URINE CULTURE

## 2014-03-12 NOTE — Assessment & Plan Note (Addendum)
Etiology unclear , in the absence of nausea , change in bowel habits, normal UA.  Exam is nontender.  Will follow for now and consider noncontrasted CT scan if persistent, given her history of breast cancer.

## 2014-03-12 NOTE — Assessment & Plan Note (Signed)
She was treated empirically for UTi but UA and culture were negative. As it is again today.  Estrace titrial was discussed,  but too costly.  Trial of triamcinolone cream to urethra. If no improvement advised to see GYN to have pessary removed.

## 2014-04-08 ENCOUNTER — Other Ambulatory Visit: Payer: Self-pay | Admitting: Internal Medicine

## 2014-04-10 ENCOUNTER — Telehealth: Payer: Self-pay | Admitting: Internal Medicine

## 2014-04-10 ENCOUNTER — Telehealth: Payer: Self-pay | Admitting: Family Medicine

## 2014-04-10 ENCOUNTER — Other Ambulatory Visit (INDEPENDENT_AMBULATORY_CARE_PROVIDER_SITE_OTHER): Payer: Commercial Managed Care - HMO

## 2014-04-10 DIAGNOSIS — R300 Dysuria: Secondary | ICD-10-CM

## 2014-04-10 LAB — POCT URINALYSIS DIPSTICK
Bilirubin, UA: NEGATIVE
Glucose, UA: NEGATIVE
KETONES UA: NEGATIVE
Nitrite, UA: NEGATIVE
PROTEIN UA: 30
Spec Grav, UA: 1.01
UROBILINOGEN UA: 0.2
pH, UA: 6

## 2014-04-10 MED ORDER — PHENAZOPYRIDINE HCL 200 MG PO TABS: 200.0000 mg | ORAL_TABLET | Freq: Three times a day (TID) | ORAL | Status: DC | PRN

## 2014-04-10 NOTE — Telephone Encounter (Signed)
Please schedule patient when she comes in for labs for an appointment on 04/27/14 at 11.30

## 2014-04-10 NOTE — Telephone Encounter (Signed)
Rachel Reynolds called saying she thinks she has a kidney/bladder infection. She experiences burning and pain w/urination. She's wondering if she can come in soon. I didn't see anything available as far as appts. Please call the patient.  Pt ph# [phone] Thank you.

## 2014-04-10 NOTE — Telephone Encounter (Signed)
Patient to bring in urine.

## 2014-04-10 NOTE — Telephone Encounter (Signed)
Patient scheduled for Lab for possible UTI, orders in fyi only.

## 2014-04-10 NOTE — Progress Notes (Unsigned)
Patient scheduled for lab for possible UTI per DR. Tullo.

## 2014-04-10 NOTE — Telephone Encounter (Signed)
Patient Information:  Caller Name: Aubriana  Phone: [phone]  Patient: Rachel Reynolds, Rachel Reynolds  Gender: Female  DOB: 12/15/1926  Age: 80 Years  PCP: Elsie Stain Brigitte Pulse) [facility])  Office Follow Up:  Does the office need to follow up with this patient?: No  Instructions For The Office: N/A  RN Note:  Patient reports she has already spoken with someone in the [place] office who told her to come give a urine specimen and they will notify her of what she needs to do next. Advised there are no appointments available at this location today. She verbalized understanding and will go to the [place] office.  Symptoms  Reason For Call & Symptoms: Pain with urination, blood in urine at this time, frequency and urgency also reported. Has had more than 2 UTI's within the last year.  Reviewed Health History In EMR: Yes  Reviewed Medications In EMR: Yes  Reviewed Allergies In EMR: Yes  Reviewed Surgeries / Procedures: Yes  Date of Onset of Symptoms: 04/09/2014  Guideline(s) Used:  Urination Pain - Female  Disposition Per Guideline:   See Today in Office  Reason For Disposition Reached:   > 2 UTIs in last year  Advice Given:  N/A  Patient Will Follow Care Advice:  YES

## 2014-04-10 NOTE — Telephone Encounter (Signed)
Give her that,  But tell her We will move her up if the urine does not indicate infection today

## 2014-04-10 NOTE — Telephone Encounter (Signed)
First available 30 min spot I can use would be 04/27/14 at 11.30 on Thursday.

## 2014-04-10 NOTE — Telephone Encounter (Signed)
Her last episode was not infection.  We will check it again today,  i will send pyridium to her pharmacy for the pain.  Schedule her a 30 min visit new week for a pelvic.

## 2014-04-11 LAB — URINALYSIS, ROUTINE W REFLEX MICROSCOPIC
Bilirubin Urine: NEGATIVE
Ketones, ur: NEGATIVE
NITRITE: NEGATIVE
Specific Gravity, Urine: 1.01 (ref 1.000–1.030)
Total Protein, Urine: 30 — AB
UROBILINOGEN UA: 0.2 (ref 0.0–1.0)
Urine Glucose: NEGATIVE
pH: 6.5 (ref 5.0–8.0)

## 2014-04-13 ENCOUNTER — Telehealth: Payer: Self-pay

## 2014-04-13 MED ORDER — CIPROFLOXACIN HCL 250 MG PO TABS: 250.0000 mg | ORAL_TABLET | Freq: Two times a day (BID) | ORAL | Status: DC

## 2014-04-13 NOTE — Telephone Encounter (Signed)
Pt notified and verbalized understanding.

## 2014-04-13 NOTE — Telephone Encounter (Signed)
Her urine culture has not finalized yet,  But I will go ahead and treat.  She may have to have a change in antibiotics if the culture results are not what I anticipate

## 2014-04-13 NOTE — Addendum Note (Signed)
Addended by: Crecencio Mc on: 04/13/2014 02:37 PM   Modules accepted: Orders

## 2014-04-13 NOTE — Telephone Encounter (Signed)
The patient called and stated she was still having burning and painful urination. She is hoping to either be worked in for an apt, or to have something called in for her symptoms.  She stated she dropped off a urine sample on the 26th.

## 2014-04-13 NOTE — Telephone Encounter (Signed)
See prior unrouted message .  cipro  sent to pharmacy

## 2014-04-14 LAB — URINE CULTURE: Colony Count: >=100000

## 2014-04-27 ENCOUNTER — Ambulatory Visit (INDEPENDENT_AMBULATORY_CARE_PROVIDER_SITE_OTHER): Payer: Commercial Managed Care - HMO | Admitting: Internal Medicine

## 2014-04-27 ENCOUNTER — Encounter: Payer: Self-pay | Admitting: Internal Medicine

## 2014-04-27 VITALS — BP 134/62 | HR 64 | Temp 97.5°F | Resp 14 | Ht 60.0 in | Wt 110.8 lb

## 2014-04-27 DIAGNOSIS — Z23 Encounter for immunization: Secondary | ICD-10-CM

## 2014-04-27 DIAGNOSIS — D508 Other iron deficiency anemias: Secondary | ICD-10-CM

## 2014-04-27 DIAGNOSIS — D649 Anemia, unspecified: Secondary | ICD-10-CM

## 2014-04-27 DIAGNOSIS — Z0000 Encounter for general adult medical examination without abnormal findings: Secondary | ICD-10-CM

## 2014-04-27 DIAGNOSIS — Z79899 Other long term (current) drug therapy: Secondary | ICD-10-CM

## 2014-04-27 DIAGNOSIS — I10 Essential (primary) hypertension: Secondary | ICD-10-CM

## 2014-04-27 LAB — COMPREHENSIVE METABOLIC PANEL
ALBUMIN: 3.8 g/dL (ref 3.5–5.2)
ALT: 13 U/L (ref 0–35)
AST: 19 U/L (ref 0–37)
Alkaline Phosphatase: 62 U/L (ref 39–117)
BUN: 26 mg/dL — AB (ref 6–23)
CALCIUM: 9.8 mg/dL (ref 8.4–10.5)
CHLORIDE: 103 meq/L (ref 96–112)
CO2: 28 meq/L (ref 19–32)
Creatinine, Ser: 1.2 mg/dL (ref 0.4–1.2)
GFR: 45.33 mL/min — ABNORMAL LOW (ref >60.00)
Glucose, Bld: 99 mg/dL (ref 70–99)
POTASSIUM: 4 meq/L (ref 3.5–5.1)
Sodium: 138 mEq/L (ref 135–145)
Total Bilirubin: 0.8 mg/dL (ref 0.2–1.2)
Total Protein: 6.8 g/dL (ref 6.0–8.3)

## 2014-04-27 LAB — CBC WITH DIFFERENTIAL/PLATELET
Basophils Absolute: 0 10*3/uL (ref 0.0–0.1)
Basophils Relative: 0.6 % (ref 0.0–3.0)
EOS ABS: 0.1 10*3/uL (ref 0.0–0.7)
Eosinophils Relative: 1.4 % (ref 0.0–5.0)
HCT: 29.7 % — ABNORMAL LOW (ref 36.0–46.0)
Hemoglobin: 9.8 g/dL — ABNORMAL LOW (ref 12.0–15.0)
Lymphocytes Relative: 23.6 % (ref 12.0–46.0)
Lymphs Abs: 1.3 10*3/uL (ref 0.7–4.0)
MCHC: 32.9 g/dL (ref 30.0–36.0)
MCV: 96.1 fl (ref 78.0–100.0)
Monocytes Absolute: 0.3 10*3/uL (ref 0.1–1.0)
Monocytes Relative: 5.5 % (ref 3.0–12.0)
NEUTROS PCT: 68.9 % (ref 43.0–77.0)
Neutro Abs: 3.7 10*3/uL (ref 1.4–7.7)
PLATELETS: 187 10*3/uL (ref 150.0–400.0)
RBC: 3.09 Mil/uL — ABNORMAL LOW (ref 3.87–5.11)
RDW: 13 % (ref 11.5–15.5)
WBC: 5.4 10*3/uL (ref 4.0–10.5)

## 2014-04-27 LAB — TSH: TSH: 0.43 u[IU]/mL (ref 0.35–4.50)

## 2014-04-27 NOTE — Progress Notes (Signed)
Pre-visit discussion using our clinic review tool. No additional management support is needed unless otherwise documented below in the visit note.  

## 2014-04-27 NOTE — Patient Instructions (Signed)
You are doing well,  But you have lost 5 lbs!!  i want you to drink some Equate in between meals  At least twice daily  I will see you in 6 months.  I hope you will have gained 2 lbs    You had the Prevnar (pneumonia vaccine ) today

## 2014-04-29 DIAGNOSIS — Z0000 Encounter for general adult medical examination without abnormal findings: Secondary | ICD-10-CM | POA: Insufficient documentation

## 2014-04-29 NOTE — Progress Notes (Addendum)
Patient ID: Rachel Reynolds, female   DOB: 1926/09/08, 80 y.o.   MRN: [id number] The patient is here for annual Medicare wellness examination and management of other chronic and acute problems.   The risk factors are reflected in the social history.  The roster of all physicians providing medical care to patient - is listed in the Snapshot section of the chart.  Activities of daily living:  The patient is 100% independent in all ADLs: dressing, toileting, feeding as well as independent mobility  Home safety : The patient has smoke detectors in the home. They wear seatbelts.  There are no firearms at home. There is no violence in the home.   There is no risks for hepatitis, STDs or HIV. There is no   history of blood transfusion. They have no travel history to infectious disease endemic areas of the world.  The patient has seen their dentist in the last six month. They have seen their eye doctor in the last year. They admit to slight hearing difficulty with regard to whispered voices and some television programs.  They have deferred audiologic testing in the last year.  They do not  have excessive sun exposure. Discussed the need for sun protection: hats, long sleeves and use of sunscreen if there is significant sun exposure.   Diet: the importance of a healthy diet is discussed. They do have a healthy diet.  The benefits of regular aerobic exercise were discussed. She walks 4 times per week ,  20 minutes.   Depression screen: there are no signs or vegative symptoms of depression- irritability, change in appetite, anhedonia, sadness/tearfullness.  Cognitive assessment: the patient manages all their financial and personal affairs and is actively engaged. They could relate day,date,year and events; recalled 2/3 objects at 3 minutes; performed clock-face test normally.  The following portions of the patient's history were reviewed and updated as appropriate: allergies, current medications, past  family history, past medical history,  past surgical history, past social history  and problem list.  Visual acuity was not assessed per patient preference since she has regular follow up with her ophthalmologist. Hearing and body mass index were assessed and reviewed.   During the course of the visit the patient was educated and counseled about appropriate screening and preventive services including : fall prevention , diabetes screening, nutrition counseling, colorectal cancer screening, and recommended immunizations.    Objective:  BP 134/62 mmHg  Pulse 64  Temp(Src) 97.5 F (36.4 C) (Oral)  Resp 14  Ht 5' (1.524 m)  Wt 110 lb 12 oz (50.236 kg)  BMI 21.63 kg/m2  SpO2 95%  General appearance: alert, cooperative and appears stated age Ears: normal TM's and external ear canals both ears Throat: lips, mucosa, and tongue normal; teeth and gums normal Neck: no adenopathy, no carotid bruit, supple, symmetrical, trachea midline and thyroid not enlarged, symmetric, no tenderness/mass/nodules Back: symmetric, no curvature. ROM normal. No CVA tenderness. Lungs: clear to auscultation bilaterally Heart: regular rate and rhythm, S1, S2 normal, no murmur, click, rub or gallop Abdomen: soft, non-tender; bowel sounds normal; no masses,  no organomegaly Pulses: 2+ and symmetric Skin: Skin color, texture, turgor normal. No rashes or lesions Lymph nodes: Cervical, supraclavicular, and axillary nodes normal.  Assessment and Plan:  Hypertension Well controlled on current regimen. Renal function stable, no changes today.  Lab Results  Component Value Date   CREATININE 1.2 04/27/2014   Lab Results  Component Value Date   NA 138 04/27/2014   K  4.0 04/27/2014   CL 103 04/27/2014   CO2 28 04/27/2014     Medicare annual wellness visit, subsequent Annual Medicare wellness  exam was done as well as a comprehensive physical exam and management of acute and chronic conditions .  During the course  of the visit the patient was educated and counseled about appropriate screening and preventive services including : fall prevention , diabetes screening, nutrition counseling, colorectal cancer screening, and recommended immunizations.  Printed recommendations for health maintenance screenings was given.   Anemia Persistent Anemia is secondary to Chronic mild kidney disease with inappropriately low EPO levels on prior evaluation.   her hgb  Has dropped since last visit, but is still > 9.  Will refer to hematology .     CBC Latest Ref Rng 04/27/2014 11/22/2013 02/15/2013  WBC 4.0 - 10.5 K/uL 5.4 4.6 8.4  Hemoglobin 12.0 - 15.0 g/dL 9.8(L) 10.3(L) 10.1(L)  Hematocrit 36.0 - 46.0 % 29.7(L) 30.5(L) 29.5(L)  Platelets 150.0 - 400.0 K/uL 187.0 180.0 259.0    Lab Results  Component Value Date   FERRITIN 95.5 02/15/2013   Lab Results  Component Value Date   IRON 31* 02/15/2013   TIBC 286 02/15/2013   FERRITIN 95.5 02/15/2013   Lab Results  Component Value Date   CREATININE 1.2 04/27/2014   CREATININE 1.1 11/22/2013   CREATININE 1.0 03/09/2013    Updated Medication List Outpatient Encounter Prescriptions as of 04/27/2014  Medication Sig  . acetaminophen (TYLENOL) 325 MG tablet Take 650 mg by mouth every 6 (six) hours as needed.  Marland Kitchen aspirin 81 MG tablet Take 81 mg by mouth daily.    . calcium carbonate (OS-CAL) 600 MG TABS Take 600 mg by mouth 2 (two) times daily with a meal.    . diphenoxylate-atropine (LOMOTIL) 2.5-0.025 MG per tablet TAKE 1 TABLET BY MOUTH 4 TIMES DAILY AS NEEDED FOR DIARRHEA OR LOOSE STOOLS.  . DORZOLAMIDE HCL-TIMOLOL MAL OP Apply 1 drop to eye 2 (two) times daily. 1 drop into left eye twice daily  . enalapril (VASOTEC) 5 MG tablet TAKE ONE TABLET BY MOUTH EVERY DAY  . latanoprost (XALATAN) 0.005 % ophthalmic solution Place 1 drop into the left eye at bedtime.  Marland Kitchen omeprazole (PRILOSEC) 20 MG capsule Take 20 mg by mouth daily.    . potassium chloride (K-DUR) 10 MEQ tablet  Take 10 mEq by mouth daily.  Marland Kitchen triamcinolone cream (KENALOG) 0.1 % Apply 1 application topically 2 (two) times daily. To urethra  . triamterene-hydrochlorothiazide (MAXZIDE-25) 37.5-25 MG per tablet TAKE ONE TABLET BY MOUTH EVERY DAY  . vitamin B-12 (CYANOCOBALAMIN) 100 MCG tablet Take 50 mcg by mouth daily.  . [DISCONTINUED] ciprofloxacin (CIPRO) 250 MG tablet Take 1 tablet (250 mg total) by mouth 2 (two) times daily.  . [DISCONTINUED] ciprofloxacin (CIPRO) 250 MG tablet Take 1 tablet (250 mg total) by mouth 2 (two) times daily.  . [DISCONTINUED] phenazopyridine (PYRIDIUM) 200 MG tablet Take 1 tablet (200 mg total) by mouth 3 (three) times daily as needed for pain.  . [DISCONTINUED] phenazopyridine (PYRIDIUM) 200 MG tablet Take 1 tablet (200 mg total) by mouth 3 (three) times daily as needed for pain.

## 2014-04-29 NOTE — Assessment & Plan Note (Signed)

## 2014-04-29 NOTE — Assessment & Plan Note (Signed)
Well controlled on current regimen. Renal function stable, no changes today.  Lab Results  Component Value Date   CREATININE 1.2 04/27/2014   Lab Results  Component Value Date   NA 138 04/27/2014   K 4.0 04/27/2014   CL 103 04/27/2014   CO2 28 04/27/2014

## 2014-04-30 NOTE — Assessment & Plan Note (Addendum)
Persistent Anemia is secondary to Chronic mild kidney disease with inappropriately low EPO levels on prior evaluation.   her hgb  Has dropped since last visit, but is still > 9.  Will refer to hematology .

## 2014-05-02 ENCOUNTER — Ambulatory Visit: Payer: Self-pay | Admitting: Internal Medicine

## 2014-05-02 NOTE — Addendum Note (Signed)
Addended by: Crecencio Mc on: 05/02/2014 08:25 AM   Modules accepted: Orders

## 2014-05-09 ENCOUNTER — Ambulatory Visit: Payer: Self-pay | Admitting: Internal Medicine

## 2014-05-09 LAB — RETICULOCYTES
ABSOLUTE RETIC COUNT: 0.0461 10*6/uL (ref 0.019–0.186)
Reticulocyte: 1.52 % (ref 0.4–3.1)

## 2014-05-09 LAB — CBC CANCER CENTER
BASOS ABS: 0 x10 3/mm (ref 0.0–0.1)
Basophil %: 0.7 %
EOS ABS: 0.1 x10 3/mm (ref 0.0–0.7)
Eosinophil %: 1.3 %
HCT: 29.8 % — ABNORMAL LOW (ref 35.0–47.0)
HGB: 9.8 g/dL — ABNORMAL LOW (ref 12.0–16.0)
LYMPHS PCT: 27 %
Lymphocyte #: 1.5 x10 3/mm (ref 1.0–3.6)
MCH: 32.3 pg (ref 26.0–34.0)
MCHC: 33 g/dL (ref 32.0–36.0)
MCV: 98 fL (ref 80–100)
MONO ABS: 0.4 x10 3/mm (ref 0.2–0.9)
MONOS PCT: 8 %
Neutrophil #: 3.5 x10 3/mm (ref 1.4–6.5)
Neutrophil %: 63 %
PLATELETS: 168 x10 3/mm (ref 150–440)
RBC: 3.04 10*6/uL — AB (ref 3.80–5.20)
RDW: 13.2 % (ref 11.5–14.5)
WBC: 5.5 x10 3/mm (ref 3.6–11.0)

## 2014-05-09 LAB — LACTATE DEHYDROGENASE: LDH: 191 U/L (ref 81–246)

## 2014-05-09 LAB — IRON AND TIBC
IRON BIND. CAP.(TOTAL): 358 ug/dL (ref 250–450)
IRON: 65 ug/dL (ref 50–170)
Iron Saturation: 18 %
UNBOUND IRON-BIND. CAP.: 293 ug/dL

## 2014-05-09 LAB — FERRITIN: Ferritin (ARMC): 85 ng/mL (ref 8–388)

## 2014-05-09 LAB — FOLATE: Folic Acid: 23.6 ng/mL (ref 3.1–100.0)

## 2014-05-11 LAB — PROT IMMUNOELECTROPHORES(ARMC)

## 2014-05-16 ENCOUNTER — Ambulatory Visit: Payer: Self-pay | Admitting: Internal Medicine

## 2014-05-17 LAB — OCCULT BLOOD X 1 CARD TO LAB, STOOL
OCCULT BLOOD, FECES: NEGATIVE
Occult Blood, Feces: NEGATIVE

## 2014-06-16 ENCOUNTER — Ambulatory Visit: Payer: Self-pay | Admitting: Internal Medicine

## 2014-07-03 ENCOUNTER — Other Ambulatory Visit: Payer: Self-pay | Admitting: Internal Medicine

## 2014-09-20 ENCOUNTER — Observation Stay
Admit: 2014-09-20 | Disposition: A | Discharge status: Home / Self Care | Payer: Self-pay | Attending: Internal Medicine | Admitting: Internal Medicine

## 2014-09-20 ENCOUNTER — Other Ambulatory Visit: Payer: Self-pay | Admitting: Physician Assistant

## 2014-09-20 DIAGNOSIS — I10 Essential (primary) hypertension: Secondary | ICD-10-CM | POA: Diagnosis not present

## 2014-09-20 DIAGNOSIS — R079 Chest pain, unspecified: Secondary | ICD-10-CM | POA: Diagnosis not present

## 2014-09-20 DIAGNOSIS — I340 Nonrheumatic mitral (valve) insufficiency: Secondary | ICD-10-CM | POA: Diagnosis not present

## 2014-09-20 LAB — BASIC METABOLIC PANEL
Anion Gap: 9 (ref 7–16)
BUN: 37 mg/dL — AB
CHLORIDE: 102 mmol/L
Calcium, Total: 9 mg/dL
Co2: 26 mmol/L
Creatinine: 1.18 mg/dL — ABNORMAL HIGH
GFR CALC AF AMER: 49 — AB
GFR CALC NON AF AMER: 42 — AB
GLUCOSE: 138 mg/dL — AB
Potassium: 3.6 mmol/L
SODIUM: 137 mmol/L

## 2014-09-20 LAB — CBC
HCT: 31.6 % — AB (ref 35.0–47.0)
HGB: 10.4 g/dL — ABNORMAL LOW (ref 12.0–16.0)
MCH: 31.3 pg (ref 26.0–34.0)
MCHC: 33 g/dL (ref 32.0–36.0)
MCV: 95 fL (ref 80–100)
Platelet: 162 10*3/uL (ref 150–440)
RBC: 3.33 10*6/uL — ABNORMAL LOW (ref 3.80–5.20)
RDW: 13.4 % (ref 11.5–14.5)
WBC: 7.8 10*3/uL (ref 3.6–11.0)

## 2014-09-20 LAB — HEPATIC FUNCTION PANEL A (ARMC)
ALK PHOS: 70 U/L
Albumin: 4.1 g/dL
BILIRUBIN INDIRECT: 0.1
Bilirubin, Direct: 0.1 mg/dL
Bilirubin,Total: 0.2 mg/dL — ABNORMAL LOW
SGOT(AST): 18 U/L
SGPT (ALT): 13 U/L — ABNORMAL LOW
TOTAL PROTEIN: 6.4 g/dL — AB

## 2014-09-20 LAB — FERRITIN: Ferritin (ARMC): 50 ng/mL

## 2014-09-20 LAB — IRON AND TIBC
IRON BIND. CAP.(TOTAL): 358 (ref 250–450)
Iron Saturation: 12.3
Iron: 44 ug/dL
UNBOUND IRON-BIND. CAP.: 313.7

## 2014-09-20 LAB — TROPONIN I: Troponin-I: <0.03 ng/mL

## 2014-09-20 LAB — PRO B NATRIURETIC PEPTIDE: B-Type Natriuretic Peptide: 66 pg/mL

## 2014-09-21 LAB — CBC WITH DIFFERENTIAL/PLATELET
Basophil #: 0 10*3/uL (ref 0.0–0.1)
Basophil %: 0.5 %
EOS ABS: 0.1 10*3/uL (ref 0.0–0.7)
Eosinophil %: 1.1 %
HCT: 30.5 % — ABNORMAL LOW (ref 35.0–47.0)
HGB: 10.1 g/dL — ABNORMAL LOW (ref 12.0–16.0)
LYMPHS PCT: 18 %
Lymphocyte #: 1.1 10*3/uL (ref 1.0–3.6)
MCH: 31.2 pg (ref 26.0–34.0)
MCHC: 33.1 g/dL (ref 32.0–36.0)
MCV: 94 fL (ref 80–100)
MONOS PCT: 7.5 %
Monocyte #: 0.5 x10 3/mm (ref 0.2–0.9)
Neutrophil #: 4.6 10*3/uL (ref 1.4–6.5)
Neutrophil %: 72.9 %
Platelet: 146 10*3/uL — ABNORMAL LOW (ref 150–440)
RBC: 3.24 10*6/uL — ABNORMAL LOW (ref 3.80–5.20)
RDW: 13.4 % (ref 11.5–14.5)
WBC: 6.3 10*3/uL (ref 3.6–11.0)

## 2014-09-21 LAB — LIPID PANEL
Cholesterol: 135 mg/dL
HDL Cholesterol: 49 mg/dL
Ldl Cholesterol, Calc: 71 mg/dL
Triglycerides: 73 mg/dL
VLDL Cholesterol, Calc: 15 mg/dL

## 2014-09-21 LAB — BASIC METABOLIC PANEL
Anion Gap: 8 (ref 7–16)
BUN: 31 mg/dL — ABNORMAL HIGH
CHLORIDE: 104 mmol/L
CREATININE: 1.17 mg/dL — AB
Calcium, Total: 8.4 mg/dL — ABNORMAL LOW
Co2: 27 mmol/L
EGFR (African American): 49 — ABNORMAL LOW
GFR CALC NON AF AMER: 42 — AB
GLUCOSE: 102 mg/dL — AB
Potassium: 3.3 mmol/L — ABNORMAL LOW
SODIUM: 139 mmol/L

## 2014-09-22 ENCOUNTER — Telehealth: Payer: Self-pay

## 2014-09-22 NOTE — Telephone Encounter (Signed)
Patient contacted regarding discharge from [facility] on 09/21/14.  Patient understands to follow up with Dr. Rockey Situ on 10/06/14 at 2:45 at [facility]. Patient understands discharge instructions? yes Patient understands medications and regiment? yes Patient understands to bring all medications to this visit? yes  Pt states that she has not taken her metoprolol, as she read the side effects and is afraid that she will have them all. After some discussion, pt is agreeable to start taking this tonight.  Asked her to call back w/ any questions or concerns.

## 2014-09-25 ENCOUNTER — Encounter: Payer: Self-pay | Admitting: Cardiovascular Disease

## 2014-09-30 ENCOUNTER — Other Ambulatory Visit: Payer: Self-pay | Admitting: Internal Medicine

## 2014-10-03 NOTE — Consult Note (Signed)
General Aspect Ms. Goral is an 81 year old pleasant Caucasian female with history of systemic hypertension and history of transient ischemic attacks who presents with chest pain. She received Soma x 1 this afternoon and per her nurse has become both sedated and confused.  Per the nurse she was fine before receiving Soma.  Presently, the patient reports that she feels "silly".  Unfortunately, she is unable to provide history at this time. Per report, she was in good health until yesterday at 4pm when she developed 8/10 tighness across her chest and into both arms.  This was associated with mild SOB.  She received slNTG with resolution of her pain.  She has remained chest pain free here.  She has also complained of abdminal pain and loose stools.  An abdominal CT has been obtained though results remain pending at this time.  She develops leg cramps this afternoon and received soma resulting in confusion (as above). Presently, she is without complaint.    Present Illness PMH: 1. Prior TIA 2. Systemic hypertension.  3. Gastroesophageal reflux disease.  4. Glaucoma. 5. Anxiety.   PAST SURGICAL HISTORY:  1. Appendectomy.  2. Hysterectomy. 3. Bilateral cataract surgery. 4. Bladder tack. 5. Colon polyp resection.    SOCIAL HISTORY: She is widowed. [place].  Denies TED  FAMILY HISTORY: Per report, her father died at old age at age of 21. Her mother died at age of 51 from complications of cerebral hemorrhage.   Home Meds:  1. Prilosec 20 mg a day. 2. Vitamin B12 1000 mcg once a day orally.  3. Tylenol p.r.n.  4. Maxzide 25 mg once a day. 5. Enalapril 5 mg a day. 6. Aspirin 81 mg a day.  7. Calcium with vitamin D 600 mg twice a day.   ALLERGIES: Bee sting, codeine, sulfa, Lipitor, Evista, morphine and meloxicam. The type of reaction or allergy is unknown.   ROS:  pt is presently unable to provide   Physical Exam:   GEN well developed, well nourished, sleeping  but rouses and is confused (per nurse due to Soma)    HEENT PERRL, Oropharynx clear    NECK supple    RESP normal resp effort  clear BS    CARD Regular rate and rhythm    ABD denies tenderness  normal BS    LYMPH negative neck    EXTR negative cyanosis/clubbing, negative edema    SKIN normal to palpation    NEURO motor/sensory function intact    PSYCH sleeping but rouses, confused (due to soma)   Lab Results: Hepatic:  24-Aug-13 20:29    Bilirubin, Total 0.5   Alkaline Phosphatase 78   SGPT (ALT) 17   SGOT (AST) 29   Total Protein, Serum 7.8   Albumin, Serum 4.6  Routine Chem:  24-Aug-13 20:29    Glucose, Serum 94   BUN  20   Creatinine (comp) 1.15   Sodium, Serum 143   Potassium, Serum  3.2   Chloride, Serum 103   CO2, Serum 30   Calcium (Total), Serum 9.2   Osmolality (calc) 287   eGFR (African American)  51   eGFR (Non-African American)  44 (eGFR values <28m/min/1.73 m2 may be an indication of chronic kidney disease (CKD). Calculated eGFR is useful in patients with stable renal function. The eGFR calculation will not be reliable in acutely ill patients when serum creatinine is changing rapidly. It is not useful in  patients on dialysis. The eGFR calculation  may not be applicable to patients at the low and high extremes of body sizes, pregnant women, and vegetarians.)   Anion Gap 10  Cardiac:  24-Aug-13 20:29    Troponin I < 0.02 (0.00-0.05 0.05 ng/mL or less: NEGATIVE  Repeat testing in 3-6 hrs  if clinically indicated. >0.05 ng/mL: POTENTIAL  MYOCARDIAL INJURY. Repeat  testing in 3-6 hrs if  clinically indicated. NOTE: An increase or decrease  of 30% or more on serial  testing suggests a  clinically important change)   CK, Total 116   CPK-MB, Serum 1.0 (Result(s) reported on 07 Feb 2012 at 09:20PM.)  25-Aug-13 05:09    Troponin I < 0.02 (0.00-0.05 0.05 ng/mL or less: NEGATIVE  Repeat testing in 3-6 hrs  if clinically indicated. >0.05  ng/mL: POTENTIAL  MYOCARDIAL INJURY. Repeat  testing in 3-6 hrs if  clinically indicated. NOTE: An increase or decrease  of 30% or more on serial  testing suggests a  clinically important change)    12:41    Troponin I < 0.02 (0.00-0.05 0.05 ng/mL or less: NEGATIVE  Repeat testing in 3-6 hrs  if clinically indicated. >0.05 ng/mL: POTENTIAL  MYOCARDIAL INJURY. Repeat  testing in 3-6 hrs if  clinically indicated. NOTE: An increase or decrease  of 30% or more on serial  testing suggests a  clinically important change)  Routine Hem:  24-Aug-13 20:29    WBC (CBC) 5.7   RBC (CBC) 4.03   Hemoglobin (CBC) 12.9   Hematocrit (CBC) 37.4   Platelet Count (CBC) 152 (Result(s) reported on 07 Feb 2012 at 09:01PM.)   MCV 93   MCH 32.0   MCHC 34.5   RDW 13.2   EKG:   EKG Interp. by me  sinus rhythm 71 bpm, PR 138, QRS 72, Qtc 432, no ischemic changes   Radiology Results: XRay:    24-Aug-13 20:00, Chest Portable Single View   Chest Portable Single View    REASON FOR EXAM:    Chest Pain  COMMENTS:       PROCEDURE: DXR - DXR PORTABLE CHEST SINGLE VIEW  - Feb 07 2012  8:00PM     RESULT: Frontal view of the chest is performed. Comparison is made to a   prior study dated 04/26/2011.    The patient has taken a shallow inspiration. An area of nodular density   projects within the right lung base. This appears to have developed in   the interim. There is blunting of the left costophrenic angle. No further   focal regions of consolidation or further focal infiltrates are   appreciated. There is mild prominence of the interstitial markings. The   cardiac silhouette and visualized bony skeleton are unremarkable.    IMPRESSION:  Nodular appearing density within the right lung base.   Differential considerations are atelectasis versus infiltrate, versus   more ominous etiology such as a primary pulmonary nodule. Repeat   evaluation with standard two view chest is recommended. If this area    persists, repeat surveillance status post appropriate therapeutic   regiment is recommended and/or further evaluation with CT status post   appropriate therapeutic regiment if the region persists.    Thank you for this opportunity to contribute to the care of your patient.           Verified By: Mikki Santee, M.D., MD    Bee Stings: Swelling, Other  Codeine: Unknown  Sulfa drugs: Unknown  Lipitor: Unknown  Evista: Unknown  Morphine: Unknown  Meloxicam: Unknown  Vital Signs/Nurse's Notes: **Vital Signs.:   25-Aug-13 14:56   Temperature Temperature (F) 97.8   Celsius 36.5   Temperature Source oral   Pulse Pulse 65   Respirations Respirations 18   Systolic BP Systolic BP 810   Diastolic BP (mmHg) Diastolic BP (mmHg) 60   Mean BP 80   Pulse Ox % Pulse Ox % 97   Pulse Ox Activity Level  At rest   Oxygen Delivery Room Air/ 21 %     Impression Pleasant elderly female admitted with chest pain.  Unfortunately, my ability to obtain a history is limited due to her receiving Manuela Neptune which has caused sedation/ confusion.  Per report, her chest pain appears to have had both typical and atypical features.  Presently, she is chest pain free.  Her CMs are negative thus far and ekg is without ischemic changes.    1. Chest pain-  given her advanced age, I agree with a conservative management.  If her CMs remain negative and chest pain does not return, then a stress test tomorrow is appropriate.  If low risk, then medical therapy is advised long term.  If she has a significantly abnormal myoview, then we could consider cath. ASA, metoprolol, and nitroglycerin She has also been placed on plavix by the primary team.  2. HTN- presently controlled no changes  3. Hypokalemia- she has been given KCL by primary team  We will follow with you.   Electronic Signatures: Coralyn Mark (MD)  (Signed 25-Aug-13 17:47)  Authored: General Aspect/Present Illness, History and Physical Exam, Labs,  EKG , Radiology, Allergies, Vital Signs/Nurse's Notes, Impression/Plan   Last Updated: 25-Aug-13 17:47 by Coralyn Mark (MD)

## 2014-10-03 NOTE — H&P (Signed)
PATIENT NAME:  Rachel Reynolds, Rachel Reynolds MR#:  [id number] DATE OF BIRTH:  [birth year]  DATE OF ADMISSION:  02/07/2012  PRIMARY CARE PHYSICIAN: Dr. Deborra Medina   REFERRING PHYSICIAN: Dr. Ferman Hamming   CHIEF COMPLAINT: Chest pain.   HISTORY OF PRESENT ILLNESS: Rachel Reynolds is an 81 year old pleasant Caucasian female with history of systemic hypertension and history of transient ischemic attacks. She was in her usual state of health until this evening around 4:00 p.m. when she started experiencing chest pain across her chest from the right to the left radiating to both arms. The severity of the pain was 8 on a scale of 10, described as heaviness on the chest, associated with mild shortness of breath and mild nausea but no vomiting. The pain had eased off a little bit for now, down to 2 to 3 on a scale of 10. Her blood pressure noted to be elevated with systolic of 174 over diastolic of 85. Her first troponin is normal and her EKG is unremarkable. Patient was admitted for further evaluation and management. She also indicated that over the last few weeks she noticed that whenever she eats her food goes through her and has loose bowel movements. She reports no fever. No chills. No hematochezia. No melena.    REVIEW OF SYSTEMS: CONSTITUTIONAL: No fever. No chills. No fatigue. EYES: No blurring of vision. No double vision. ENT: No hearing impairment. No sore throat. No dysphagia. CARDIOVASCULAR: Reports chest pain and mild shortness of breath. No edema. No syncope. RESPIRATORY: No cough. No sputum production. No hemoptysis but reports the chest pain and mild shortness of breath. GASTROINTESTINAL: No abdominal pain. No vomiting. Has mild nausea and some loose bowel movements. No melena. No hematochezia. GENITOURINARY: No dysuria. No frequency of urination. MUSCULOSKELETAL: No joint pain or swelling. No muscular pain or swelling. INTEGUMENTARY: No skin rash. No ulcers. NEUROLOGY: No focal weakness. No seizure activity.  No headache. PSYCHIATRY: No anxiety. No depression. ENDOCRINE: No polyuria or polydipsia. No heat or cold intolerance.   PAST MEDICAL HISTORY:  1. Last admission here was in November 2012 admitted with suspected transient ischemic attack and she had also prior transient ischemic attack.  2. Systemic hypertension.  3. Gastroesophageal reflux disease.  4. Glaucoma. 5. Anxiety.   PAST SURGICAL HISTORY:  1. Appendectomy.  2. Hysterectomy. 3. Bilateral cataract surgery. 4. Bladder tack. 5. Colon polyp resection.   SOCIAL HABITS: Nonsmoker. No history of alcohol or drug abuse.   SOCIAL HISTORY: She is widowed. [place] [facility] [place].   FAMILY HISTORY: Her father died at old age at age of 3. Her mother died at age of 66 from complications of cerebral hemorrhage.   ADMISSION MEDICATIONS:  1. Prilosec 20 mg a day. 2. Vitamin B12 1000 mcg once a day orally.  3. Tylenol p.r.n.  4. Maxzide 25 mg once a day. 5. Enalapril 5 mg a day. 6. Aspirin 81 mg a day.  7. Calcium with vitamin D 600 mg twice a day.   ALLERGIES: Bee sting, codeine, sulfa, Lipitor, Evista, morphine and meloxicam. The type of reaction or allergy is unknown.   PHYSICAL EXAMINATION:  VITAL SIGNS: Blood pressure 179/85, respiratory rate 18, pulse 83, oxygen saturation 97%.   GENERAL APPEARANCE: Elderly female lying in bed in no acute distress.   HEAD AND NECK EXAMINATION: No pallor. No icterus. No cyanosis.   ENT: Hearing was normal. Nasal mucosa, lips, tongue were normal.   EYES: Normal eyelids and conjunctivae. Pupils about 4 to 5  mm, sluggishly reactive to light.   NECK: Supple. Trachea at midline. No thyromegaly. No cervical lymphadenopathy. No masses.   HEART: Normal S1, S2. No S3 or S4. No murmur. No gallop. No carotid bruits.   RESPIRATORY: Normal breathing pattern without use of accessory muscles. No rales. No wheezing.   ABDOMEN: Soft without tenderness. No hepatosplenomegaly. No masses. No  hernias.   SKIN: No ulcers. No subcutaneous nodules.   MUSCULOSKELETAL: No joint swelling. No clubbing.   NEUROLOGIC: Cranial nerves II through XII are intact. No focal motor deficit.   PSYCHIATRY: Patient is alert, oriented x3. Mood and affect were flat.   LABORATORY, DIAGNOSTIC AND RADIOLOGICAL DATA: EKG showed normal sinus rhythm at rate of 71 per minute. Incomplete right bundle branch block, otherwise unremarkable EKG. Serum glucose 94, BUN 20, creatinine 1.1, sodium 143, potassium 3.2. Normal liver function tests. CPK 116. Troponin less than 0.02. CBC showed white count 5000, hemoglobin 12.9, hematocrit 37, platelet count 152.   ASSESSMENT:  1. Chest pain suspicious for unstable angina with normal EKG and normal troponin.  2. Systemic hypertension, uncontrolled.  3. History of transient ischemic attacks.  4. Gastroesophageal reflux disease.  5. History of anxiety and depression.  6. Hypokalemia.   PLAN: Will admit the patient to telemetry monitoring for observation. Follow up on cardiac enzymes. Continue aspirin 81 mg a day and add Plavix 75 mg a day. Add beta blocker, metoprolol tartrate 25 mg twice a day as antianginal and at the same time antihypertensive medication. I will obtain cardiology consultation and also schedule her for nuclear cardiac testing. Deep vein thrombosis prophylaxis with Lovenox 40 mg sub-Q once a day. I also would like to add nitroglycerin patch or ointment which already ordered at the Emergency Department. Potassium supplementation to correct the hypokalemia. I answered the patient's questions and I discussed the findings with her daughter and her son. Patient indicates that she has a LIVING WILL and she has a copy at home but she does not reports anything in particular or specifics about her will.   TIME SPENT: Time spent evaluating this patient and reviewing medical records took more than 55 minutes.    ____________________________ Clovis Pu. Lenore Manner,  MD amd:cms D: 02/07/2012 23:00:19 ET T: 02/08/2012 09:13:00 ET JOB#: [id number]  cc: Clovis Pu. Lenore Manner, MD, <Dictator> Deborra Medina, MD Mike Craze Irven Coe MD ELECTRONICALLY SIGNED 02/08/2012 22:28

## 2014-10-03 NOTE — Op Note (Signed)
PATIENT NAME:  Rachel Reynolds, Rachel Reynolds MR#:  [id number] DATE OF BIRTH:  May 17, 1927  DATE OF PROCEDURE:  05/06/2012  PREOPERATIVE DIAGNOSIS: Lobular carcinoma of the right breast.   POSTOPERATIVE DIAGNOSIS: Lobular carcinoma of the right breast.   PROCEDURE: Right partial mastectomy with axillary sentinel lymph node biopsy.   SURGEON: Rochel Brome, MD   ANESTHESIA: General.   INDICATION: This 81 year old female had a mammogram depicting a 1.2 cm spiculated irregular mass at the upper outer quadrant right breast. Ultrasound demonstrated a 1.1 cm mass. Ultrasound-guided core biopsy demonstrated an invasive lobular carcinoma. She had preoperative x-ray needle localization and preoperative injection of radioactive technetium sulfur colloid.   DESCRIPTION OF PROCEDURE: The patient was placed on the operating table in the supine position under general anesthesia. The dressing was removed from the lateral aspect of the right breast exposing the Kopans wire which was cut 2 cm from the skin. Her arm was placed on a lateral arm support. The right breast, axilla, and chest wall and upper arm were prepared with ChloraPrep and draped in a sterile manner.   The sentinel lymph node biopsy was done first. The gamma counter was used and demonstrated the location of radioactivity in the inferior aspect of the right axilla. An oblique incision was made some 4 cm in length, carried down through subcutaneous tissues. Numerous small bleeding points were cauterized. Dissection was carried down through superficial fascia down deep within the axilla adjacent to the rib cage where, with using the gamma counter, a lymph node was encountered with radioactivity. This lymph node appeared to be some 1 cm in size and was excised using electrocautery for hemostasis, removed some fatty tissue surrounding the lymph node, and the ex vivo count was 250 to 260 counts per second, and submitted for routine pathology. The background count was  less than 15. There was no palpable mass within the axilla. Hemostasis was intact.   Next, attention was turned to do the right partial mastectomy. There was some firmness in the breast at the site of the thickener of the Kopans wire. I also reviewed the mammogram images prior to incision. A curvilinear incision was made in the upper outer quadrant from the 8:00 to 11:00 position. I also removed approximately a 1 cm wide width of skin directly overlying the mass. Dissection was carried down to encounter the wire, and I could palpate the mass and dissection was carried out removing the mass with surrounding normal tissue. There was one place where there was some hard tissue which came fairly close to the skin more laterally located, and it appeared that it may be tumor, and it appeared that getting additional margin would involve removing an ellipse of skin at the 9:00 of the breast. The specimen was excised and was oriented so that a stitch was placed at the 11:00 position of the skin ellipse for the pathologist's orientation, and also margin markers were sutured to the specimen to mark the medial, lateral, caudal, cranial, and deep margins and submitted for pathology. Next, a triangular shaped portion of the skin was excised from the lateral aspect of the wound which was approximately 1.8 x 4 cm in length. This was the new lateral margin, and the skin at the new lateral margin was tagged with a stitch and submitted for pathology. The wound was inspected. There was no remaining mass within the wound. It is noted that one clamped vessel was suture ligated with 4-0 chromic. Hemostasis was subsequently intact.   The axillary  wound was inspected. Hemostasis was intact there, and the wound was closed with a running 4-0 Monocryl subcuticular suture.   Next, returning to the partial mastectomy wound, the subcutaneous tissues were closed with interrupted 4-0 chromic, and the incision was closed with running 4-0  Monocryl subcuticular suture.   The pathologist called back to indicate that the margins appeared clear and subsequently the wounds were dressed with Dermabond and allowed to dry. The patient was in satisfactory condition and was prepared for transfer to the recovery room.  ____________________________ Lenna Sciara. Rochel Brome, MD jws:cbb D: 05/06/2012 15:46:34 ET T: 05/06/2012 17:24:41 ET JOB#: [id number]  cc: Loreli Dollar, MD, <Dictator> Loreli Dollar MD ELECTRONICALLY SIGNED 05/08/2012 0:15

## 2014-10-03 NOTE — Discharge Summary (Signed)
PATIENT NAME:  Rachel Reynolds, Rachel Reynolds MR#:  [id number] DATE OF BIRTH:  [birth year]  DATE OF ADMISSION:  02/08/2012 DATE OF DISCHARGE:  02/09/2012  PRIMARY CARE PHYSICIAN: Dr. Derrel Nip   Final diagnosis:  1. Chest pain.  2. Abdominal pain and chronic diarrhea.  3. Gastroesophageal reflux disease.  4. Hypertension.  5. Hypomagnesemia and hypokalemia.  6. Breast mass seen on computed tomography scan.  7. Chronic kidney disease, stage III.   MEDICATIONS ON DISCHARGE:  1. Caltrate plus D 1 tablet twice a day. 2. Vitamin B12 1000 mcg 1 tablet daily.  3. Aspirin 81 mg daily.  4. Tylenol arthritis 650 mg extended-release daily.  5. Enalapril 5 mg daily.  6. Prilosec 20 mg in the a.m.  7. Metoprolol tartrate 25 mg 1 tablet twice a day.  8. Magnesium oxide 400 mg 1 tablet twice a day.  9. Potassium chloride 10 mEq once a day.  10. Stop Maxzide.   DIET: Low sodium diet, regular consistency.   ACTIVITY: Activity as tolerated.   FOLLOW UP: Follow up with Dr. Fletcher Reynolds next week. Follow up with Dr. Derrel Nip one week. Will need outpatient colonoscopy. Will need outpatient mammogram.   REASON FOR ADMISSION: Patient was admitted as an observation on 02/07/2012, discharged 02/09/2012. Came in with chest pain.   HISTORY OF PRESENT ILLNESS: This is an 81 year old female with hypertension, transient ischemic attack. She started experiencing chest pain radiating to both arms, short of breath, blood pressure was elevated. Patient was admitted for chest pain suspicious for unstable angina, hypertension.   LABORATORY, DIAGNOSTIC, AND RADIOLOGICAL DATA: Chest x-ray showed a nodular-appearing density within the right lung base. Differential atelectasis versus infiltrate versus nodule. Glucose 94, BUN 20, creatinine 1.15, sodium 143, potassium 3.2, chloride 103, CO2 30, calcium 9.2. Liver function tests normal range. White blood cell count 5.7, hemoglobin and hematocrit 12.9 and 37.4, platelet count 152. Cardiac enzymes  x3 were negative. When I saw the patient the patient was complaining more of abdominal pain so I ordered a CT scan of the abdomen and pelvis that showed no acute findings in the abdomen and pelvis. Diverticulosis of the sigmoid colon without evidence of diverticulitis. Findings concerning for small 1.3 cm breast mass right lateral breast. Mammogram recommended. Magnesium came back at 1.4. Repeat potassium 3.7. Stress test showed no evidence of ischemia or infarct on perfusion imaging, however, the patient had significant ST depression which could indicate a balanced ischemia. Clinical correlation recommended. Ejection fraction 70%. Stool for C. difficile was negative. Stool for white blood cells 0 to 5 per field and 58% were polymorphs and 50% eosinophils. There was not enough stool to perform a comprehensive culture or ova and parasites.   HOSPITAL COURSE PER PROBLEM LIST:  1. For the patient's chest pain, patient had a stress test that was essentially negative. Dr. Fletcher Reynolds saw the patient after the stress test and since the pain was atypical medical management was recommended with aspirin and metoprolol. Follow up next week with Dr. Fletcher Reynolds. If any further chest pain can consider cardiac catheterization but pain was atypical. Cardiac enzymes were negative. This was not a myocardial infarction.  2. Abdominal pain and chronic diarrhea. CT scan of the abdomen was unremarkable. The patient only had a small episode of diarrhea, sent off to the lab and C. difficile was negative and stool leukocytes basically negative. Can consider doing outpatient stool for ova and parasites and comprehensive culture with Dr. Derrel Nip. Would recommend a colonoscopy as outpatient.  3. Gastroesophageal reflux  disease. Can continue Prilosec. Of note, side effect of Prilosec can be diarrhea. Can consider stopping in the future and doing a trial without PPI.  4. Hypertension. Blood pressure was elevated initially, did come down with  treatment; 139/76 upon discharge.  5. Hypomagnesemia and hypokalemia. I did stop the Maxzide and give potassium and magnesium supplementation upon discharge.  6. Breast mass seen on CT scan. I did feel a nodular density with breast exam. Need to correlate with mammogram and further testing as outpatient. Dr. Derrel Nip can follow up on this.   7. Chronic kidney disease, stage III. Continue to monitor as outpatient. Creatinine clearance 43.   TIME SPENT ON DISCHARGE: 35 minutes.   ____________________________ Tana Conch. Leslye Peer, MD rjw:cms D: 02/10/2012 16:54:00 ET T: 02/11/2012 13:21:05 ET JOB#: [id number]  cc: Tana Conch. Leslye Peer, MD, <Dictator> Deborra Medina, MD Marisue Brooklyn MD ELECTRONICALLY SIGNED 02/11/2012 16:52

## 2014-10-06 ENCOUNTER — Ambulatory Visit (INDEPENDENT_AMBULATORY_CARE_PROVIDER_SITE_OTHER): Payer: PPO | Admitting: Cardiovascular Disease

## 2014-10-06 ENCOUNTER — Telehealth: Payer: Self-pay | Admitting: *Deleted

## 2014-10-06 ENCOUNTER — Encounter: Payer: Self-pay | Admitting: Cardiovascular Disease

## 2014-10-06 VITALS — BP 162/62 | HR 60 | Ht 60.0 in | Wt 110.2 lb

## 2014-10-06 DIAGNOSIS — R42 Dizziness and giddiness: Secondary | ICD-10-CM

## 2014-10-06 DIAGNOSIS — R0602 Shortness of breath: Secondary | ICD-10-CM | POA: Diagnosis not present

## 2014-10-06 DIAGNOSIS — N183 Chronic kidney disease, stage 3 unspecified: Secondary | ICD-10-CM

## 2014-10-06 DIAGNOSIS — R110 Nausea: Secondary | ICD-10-CM | POA: Diagnosis not present

## 2014-10-06 DIAGNOSIS — I2510 Atherosclerotic heart disease of native coronary artery without angina pectoris: Secondary | ICD-10-CM

## 2014-10-06 DIAGNOSIS — I10 Essential (primary) hypertension: Secondary | ICD-10-CM | POA: Diagnosis not present

## 2014-10-06 MED ORDER — PROMETHAZINE HCL 12.5 MG RE SUPP: 12.5000 mg | Freq: Four times a day (QID) | RECTAL | Status: DC | PRN

## 2014-10-06 MED ORDER — PROMETHAZINE HCL 12.5 MG PO TABS: 12.5000 mg | ORAL_TABLET | Freq: Four times a day (QID) | ORAL | Status: AC | PRN

## 2014-10-06 NOTE — Assessment & Plan Note (Signed)
Blood pressures running high. I did suggest increasing the enalapril for blood pressure control. Husband prefers to wait at this time until nausea has improved Recommended he help monitor her blood pressure at home and call our office if this continues to run high

## 2014-10-06 NOTE — Assessment & Plan Note (Signed)
Etiology of her nausea is unclear. She has tried Reglan once. Recommended she continue to try Reglan. If no improvement in her symptoms, we have sent in a prescription for Phenergan 12.5 mg 3 times a day when necessary. She denies constipation

## 2014-10-06 NOTE — Assessment & Plan Note (Addendum)
Renal function stable in the hospital. Encouraged her to drink fluids If renal function gets worse, should consider dehydration and could hold the HCTZ

## 2014-10-06 NOTE — Telephone Encounter (Signed)
Please advise due to cost.

## 2014-10-06 NOTE — Assessment & Plan Note (Signed)
Recent normal echocardiogram and stress test. No further testing at this time

## 2014-10-06 NOTE — Patient Instructions (Signed)
  Please continue to monitor the blood pressure  For nausea, please take phenergen every 6 hours as needed  Please call us if you have new issues that need to be addressed before your next appt.

## 2014-10-06 NOTE — Telephone Encounter (Signed)
Spoke w/ Christ.   Clarified that phenergan pills will cost pt about $14 for 30 day supply.

## 2014-10-06 NOTE — Telephone Encounter (Signed)
Gerald Stabs from [employer] called letting us know that [facility] is not covered by patient insurance.  Has not contacted patient but know it may be a bit much for them.  Would like to see what can be done to help lower the cost.  Please advise.

## 2014-10-06 NOTE — Progress Notes (Signed)
Patient ID: Rachel Reynolds, female    DOB: 05/31/1927, 81 y.o.   MRN: [id number]  HPI Comments: Rachel Reynolds is an 81 year old woman with history of depression, hypertension, stroke/TIA, breast cancer status post lumpectomy in 2013, chronic kidney disease, anemia of chronic disease, GERD, mastectomy November 2013 on the right who presented to [facility] 09/20/2014 with chest pain lasting several hours. EMS gave her nitroglycerin spray with improvement of her discomfort.  In the hospital she had echocardiogram and stress test which were essentially normal, cardiac enzymes negative, she was sent home with follow-up in our office  Echocardiogram dated 09/20/2014 showing normal LV function, mild to moderate MR, moderate TR, high normal right heart pressures  Lexi scan Myoview showed no ischemia, ejection fraction 35% which was incorrect has echocardiogram showed normal LV function  Since she has been home, husband who presents with her today reports that she has been very anxious about her symptoms. She has been taking nitroglycerin here and there because she feels that she is supposed to, not because of chest pain She reports blood pressure has been running around 150, 160. She has had chronic nausea. Recently seen by Dr. Candiss Norse, started on Reglan 3 times a day She is only taking this once it does not feel like it is working She is sedentary at baseline, no regular activities. Walks with a cane, high fall risk   EKG on today's visit shows normal sinus rhythm with rate 60 bpm, no significant ST or T-wave changes   Allergies  Allergen Reactions  . [employer] [Tretinoin]   . Lipitor [Atorvastatin Calcium]   . Meloxicam   . Morphine And Related   . Sulfa Drugs Cross Reactors     Outpatient Encounter Prescriptions as of 10/06/2014  Medication Sig  . acetaminophen (TYLENOL) 325 MG tablet Take 650 mg by mouth every 6 (six) hours as needed.  Marland Kitchen aspirin 81 MG tablet Take 81 mg by  mouth daily.    . calcium carbonate (OS-CAL) 600 MG TABS Take 600 mg by mouth 2 (two) times daily with a meal.    . diphenoxylate-atropine (LOMOTIL) 2.5-0.025 MG per tablet TAKE 1 TABLET BY MOUTH 4 TIMES DAILY AS NEEDED FOR DIARRHEA OR LOOSE STOOLS.  . DORZOLAMIDE HCL-TIMOLOL MAL OP Apply 1 drop to eye 2 (two) times daily. 1 drop into left eye twice daily  . enalapril (VASOTEC) 5 MG tablet TAKE ONE TABLET BY MOUTH EVERY DAY  . latanoprost (XALATAN) 0.005 % ophthalmic solution Place 1 drop into the left eye at bedtime.  Marland Kitchen omeprazole (PRILOSEC) 20 MG capsule Take 20 mg by mouth daily.    . potassium chloride (K-DUR) 10 MEQ tablet Take 10 mEq by mouth daily.  Marland Kitchen triamcinolone cream (KENALOG) 0.1 % Apply 1 application topically 2 (two) times daily. To urethra  . triamterene-hydrochlorothiazide (MAXZIDE-25) 37.5-25 MG per tablet TAKE ONE TABLET BY MOUTH EVERY DAY  . vitamin B-12 (CYANOCOBALAMIN) 100 MCG tablet Take 50 mcg by mouth daily.  . promethazine (PHENERGAN) 12.5 MG tablet Take 1 tablet (12.5 mg total) by mouth every 6 (six) hours as needed for nausea or vomiting.  . [DISCONTINUED] promethazine (PHENERGAN) 12.5 MG suppository Place 1 suppository (12.5 mg total) rectally every 6 (six) hours as needed for nausea or vomiting.    Past Medical History  Diagnosis Date  . Hypertension   . Vaginal delivery     4  . History of MRI of brain and brain stem Nov 2012  no evidence of macroangiopathy  . GERD (gastroesophageal reflux disease)   . Glaucoma   . Chronic kidney disease     chronic kidney disease, stage II  . Benign esophageal stricture Oct 2013    dilated by Dr Candace Cruise   . Nonmelanoma skin cancer 11/06    precancerous actinic keratosis s/p liquid nitrogen, Dr. Nehemiah Massed  . breast cancer     Past Surgical History  Procedure Laterality Date  . Colon surgery    . Colon resection  2008    proximal due to precancerous polyp  . Abdominal hysterectomy  1962    due to hemorrhagic  .  Appendectomy  1948  . Bladder suspension    . Breast surgery  nov 2013    core biopsy Dr Burt Knack    Social History  reports that she has never smoked. She has never used smokeless tobacco. She reports that she does not drink alcohol or use illicit drugs.  Family History family history includes Cancer in her sister; Coronary artery disease in her brother and mother.   Review of Systems  Constitutional: Negative.   Respiratory: Negative.   Cardiovascular: Negative.   Gastrointestinal: Negative.   Musculoskeletal: Negative.   Skin: Negative.   Neurological: Negative.   Hematological: Negative.   Psychiatric/Behavioral: Negative.   All other systems reviewed and are negative.   BP 162/62 mmHg  Pulse 60  Ht 5' (1.524 m)  Wt 110 lb 4 oz (50.009 kg)  BMI 21.53 kg/m2  Physical Exam  Constitutional: She is oriented to person, place, and time. She appears well-developed and well-nourished.  HENT:  Head: Normocephalic.  Nose: Nose normal.  Mouth/Throat: Oropharynx is clear and moist.  Eyes: Conjunctivae are normal. Pupils are equal, round, and reactive to light.  Neck: Normal range of motion. Neck supple. No JVD present.  Cardiovascular: Normal rate, regular rhythm, S1 normal, S2 normal, normal heart sounds and intact distal pulses.  Exam reveals no gallop and no friction rub.   No murmur heard. Pulmonary/Chest: Effort normal and breath sounds normal. No respiratory distress. She has no wheezes. She has no rales. She exhibits no tenderness.  Abdominal: Soft. Bowel sounds are normal. She exhibits no distension. There is no tenderness.  Musculoskeletal: Normal range of motion. She exhibits no edema or tenderness.  Lymphadenopathy:    She has no cervical adenopathy.  Neurological: She is alert and oriented to person, place, and time. Coordination normal.  Skin: Skin is warm and dry. No rash noted. No erythema.  Psychiatric: She has a normal mood and affect. Her behavior is normal.  Judgment and thought content normal.    Assessment and Plan  Nursing note and vitals reviewed.

## 2014-10-15 NOTE — Consult Note (Signed)
General Aspect Primary Cardiologist: New to [facility] (last seen by [facility] 2013 as inpatient only) PRIMARY CARE PRACTITIONER: Previously, Deborra Medina, MD, and currently Glendon Axe, MD, of [facility] __________________  81 year old female with history of HTN, stroke/TIA,  history of breast cancer status post lumpectomy in 2013, CKD stage II-III, and anemia of chronic disease who presented to [facility] on 09/20/2014 with a one day history of chest pain while washing the dishes. Troponin negative x 2 so far.  _________________  PMH: 1. HTN 2. Stroke/TIA 3. History of breast cancer s/p lumpectomy in 2013 4. CKD stage II-III 5. Anemia of chronic disease 6. GERD  Past Surgical History: Procedure Laterality Date  ??? Appendectomy  ??? Mastectomy partial / lumpectomy Right 05/06/2012  Right partial mastectomy with sentinel lymph node biopsy  ??? Colon surgery  proximal due to precancerous polyp  ??? Hysterectomy  due to bleeding  ??? Bladder suspension  ??? Cataract extraction Bilateral 08/2003 _____________________   Present Illness 81 year old female with the above problem list who presented to [facility] on 09/20/2014 a one day history of chest pain while washing the dishes. She was last seen by [facility] in 2013 as an inpatient only for chest pain. She reports having had a cardiac cath done in the early 1962I, but is uncertain of the findings at that time. No cardiac caths since. In 2013 when she was admitted for chest pain she had negative cardiac enzymes and underwent Lexiscan Myoview which was negative for ischemia. It did show significant ST depression which can be occasioanlly associated with balanced ischemia. EF 70%. She remained chest pain free until 09/20/2014.   She is not very active at home.  She walks with a cane. She did recently suffer a mechanical fall a couple of weeks back 2/2 her "feet getting tangled up." She feel backwards. No broken bones. She was never a smoker.   While  washing the dishes on 09/20/2014 she developed a "scratching" chest pain that lasted for several hours. She has been having intermittent chest pain for the past 2 weeks requiring her to take frequent aspirin, which she presviously did for her prior episodes of chest pain. Chest pain was resolved by nitro spray x 2 by EMS en route to [facility] by EMS. She did have an episode of chest pain overnight around 4 AM. She has been chest pain free since. No associated SOB, diaphoresis, nausea, vomiting, presyncope, or syncope. Some palpitations.   Upon her arrival to [facility] she was noted to have an initial negative troponin of <0.03 that has remained <0.03 on the second set. EKG showed NSR, 66 bpm, 1 mm st depression V5-V6. CXR negative.   Physical Exam:  GEN well developed, no acute distress   HEENT PERRL, hearing intact to voice, moist oral mucosa   NECK supple  no JVD   RESP normal resp effort  clear BS   CARD Regular rate and rhythm  Normal, S1, S2  No murmur   ABD denies tenderness  soft   EXTR negative edema   SKIN normal to palpation   NEURO cranial nerves intact   PSYCH alert, A+O to time, place, person   Review of Systems:  General: Fatigue  Weakness   Skin: No Complaints   ENT: No Complaints   Eyes: No Complaints   Neck: No Complaints   Respiratory: No Complaints   Cardiovascular: Chest pain or discomfort  Tightness  Palpitations   Gastrointestinal: No Complaints  Genitourinary: No Complaints   Vascular: No Complaints   Musculoskeletal: No Complaints   Neurologic: recent fall   Hematologic: No Complaints   Endocrine: No Complaints   Psychiatric: No Complaints   Review of Systems: All other systems were reviewed and found to be negative   Medications/Allergies Reviewed Medications/Allergies reviewed   Family & Social History:  Family and Social History:  Family History Negative  Hypertension  Diabetes Mellitus   Social History negative tobacco, negative  ETOH, negative Illicit drugs   Place of Living Home     Breast Cancer:    Arteriosclerosis:    Anemia:    Heart Murmer:    CVA/Stroke:    Hypertension:    Bladder Surgery:    Cataract Extraction:    Hysterectomy:    Appendectomy:          Admit Diagnosis:   CHEST PAIN HYPERTENSION ANEMIA CKD: Onset Date: 20-Sep-2014, Status: Active, Description: CHEST PAIN HYPERTENSION ANEMIA CKD  Home Medications: Medication Instructions Status  Lomotil 0.025 mg-2.5 mg oral tablet 1 tab(s) orally as needed for diarrhea and loose stools Active  calcium carbonate 600 mg oral tablet 1 tab(s) orally 2 times a day (with meals) Active  enalapril 5 mg oral tablet 1 tab(s) orally once a day Active  acetaminophen 325 mg oral tablet 2 tab(s) orally every 6 hours, As Needed - for Pain Active  hydrochlorothiazide 37.5  orally once a day Active  aspirin 81 mg oral tablet 1 tab(s) orally once a day Active  potassium chloride 10 mEq oral tablet, extended release 1 tab(s) orally once a day Active   Lab Results:  Hepatic:  06-Apr-16 00:25   Bilirubin, Total  0.2 (0.3-1.2 NOTE: New Reference Range  08/22/14)  Bilirubin, Direct 0.1 (0.1-0.5 NOTE: New Reference Range  08/22/14)  Alkaline Phosphatase 70 (38-126 NOTE: New Reference Range  08/22/14)  SGPT (ALT)  13 (14-54 NOTE: New Reference Range  08/22/14)  SGOT (AST) 18 (15-41 NOTE: New Reference Range  08/22/14)  Total Protein, Serum  6.4 (6.5-8.1 NOTE: New Reference Range  08/22/14)  Albumin, Serum 4.1 (3.5-5.0 NOTE: New reference range  08/22/14)  Cardiology:  06-Apr-16 00:25   ECG interpretation Normal sinus rhythm Normal ECG When compared with ECG of 09-Jul-2013 20:31, Criteria for Septal infarct are no longer Present ----------unconfirmed---------- Confirmed by OVERREAD, NOT ([id number]), editor PEARSON, BARBARA ([id number]) on 09/20/2014 9:04:15 AM  Ventricular Rate 66  Atrial Rate 66  P-R Interval 136  QRS Duration 72  QT 402  QTc 421   P Axis 54  R Axis -22  T Axis 59  Routine Chem:  06-Apr-16 00:25   Iron Binding Capacity (TIBC) 358  Unbound Iron Binding Capacity 313.7  Iron, Serum 44 (28-170 NOTE: New Reference Range:  08/22/14)  Iron Saturation 12.3 (Result(s) reported on 20 Sep 2014 at 03:30AM.)  Ferritin [facility]) 50 (11-307 NOTE: New Reference Range  08/22/14)  Bilirubin, Indirect 0.1 (Result(s) reported on 20 Sep 2014 at 03:30AM.)  Glucose, Serum  138 (65-99 NOTE: New Reference Range  08/22/14)  BUN  37 (6-20 NOTE: New Reference Range  08/22/14)  Creatinine (comp)  1.18 (0.44-1.00 NOTE: New Reference Range  08/22/14)  Sodium, Serum 137 (135-145 NOTE: New Reference Range  08/22/14)  Potassium, Serum 3.6 (3.5-5.1 NOTE: New Reference Range  08/22/14)  Chloride, Serum 102 (101-111 NOTE: New Reference Range  08/22/14)  CO2, Serum 26 (22-32 NOTE: New Reference Range  08/22/14)  Calcium (Total), Serum 9.0 (8.9-10.3 NOTE: New Reference Range  08/22/14)  Anion Gap 9  eGFR (African American)  49  eGFR (Non-African American)  42 (eGFR values <9m/min/1.73 m2 may be an indication of chronic kidney disease (CKD). Calculated eGFR is useful in patients with stable renal function. The eGFR calculation will not be reliable in acutely ill patients when serum creatinine is changing rapidly. It is not useful in patients on dialysis. The eGFR calculation may not be applicable to patients at the low and high extremes of body sizes, pregnant women, and vegetarians.)  B-Type Natriuretic Peptide ([facility] 66 (0-99 NOTE: New Reference Range:  08/22/14)  Cardiac:  06-Apr-16 00:25   Troponin I <0.03 (0.00-0.03 0.03 ng/mL or less: NEGATIVE  Repeat testing in 3-6 hrs  if clinically indicated. >0.05 ng/mL: POTENTIAL  MYOCARDIAL INJURY. Repeat  testing in 3-6 hrs if  clinically indicated. NOTE: An increase or decrease  of 30% or more on serial  testing suggests a  clinically important change NOTE: New  Reference Range  08/22/14)    04:31   Troponin I <0.03 (0.00-0.03 0.03 ng/mL or less: NEGATIVE  Repeat testing in 3-6 hrs  if clinically indicated. >0.05 ng/mL: POTENTIAL  MYOCARDIAL INJURY. Repeat  testing in 3-6 hrs if  clinically indicated. NOTE: An increase or decrease  of 30% or more on serial  testing suggests a  clinically important change NOTE: New Reference Range  08/22/14)    10:00   Troponin I <0.03 (0.00-0.03 0.03 ng/mL or less: NEGATIVE  Repeat testing in 3-6 hrs  if clinically indicated. >0.05 ng/mL: POTENTIAL  MYOCARDIAL INJURY. Repeat  testing in 3-6 hrs if  clinically indicated. NOTE: An increase or decrease  of 30% or more on serial  testing suggests a  clinically important change NOTE: New Reference Range  08/22/14)  Routine Hem:  06-Apr-16 00:25   WBC (CBC) 7.8  RBC (CBC)  3.33  Hemoglobin (CBC)  10.4  Hematocrit (CBC)  31.6  Platelet Count (CBC) 162 (Result(s) reported on 20 Sep 2014 at 12:52AM.)  MCV 95  MCH 31.3  MCHC 33.0  RDW 13.4   EKG:  EKG Interp. by me   Interpretation EKG shows NSR, 66 bpm, 1 mm flat st depression V5-V6   Radiology Results:  XRay:    06-Apr-16 02:35, Chest PA and Lateral  Chest PA and Lateral   REASON FOR EXAM:    chest pain  COMMENTS:       PROCEDURE: DXR - DXR CHEST PA (OR AP) AND LATERAL  - Sep 20 2014  2:35AM     CLINICAL DATA:  Mid chest pain since 7 p.m..  Weakness.    EXAM:  CHEST  2 VIEW    COMPARISON:  07/09/2013    FINDINGS:  No cardiomegaly. There is stable mild aortic tortuosity. The hila  are negative. There is no edema, consolidation, effusion, or  pneumothorax. A rounded density overlapping the peripheral upper  right chest is an EKG pad. Chronic mild wedging of a mid thoracic  vertebral body, likely T6. No acute osseous findings.     IMPRESSION:  No active cardiopulmonary disease.      Electronically Signed    By: JMonte FantasiaM.D.    On: 09/20/2014 02:42          Verified By: JGilford Silvius M.D.,  Cardiology:    06-Apr-16 08:24, Echo Doppler  Echo Doppler   REASON FOR EXAM:      COMMENTS:       PROCEDURE: [place] ECHO DOPPLER COMPLETE(TRANSTHOR)  -  Sep 20 2014  8:24AM     RESULT: Echocardiogram Report    Patient Name:   Rachel Reynolds Date of Exam: 09/20/2014  Medical Rec #:  [id number]             Custom1:  Date of Birth:  05/04/1927          Height:       64.0 in  Patient Age:    16 years           Weight:       106.0 lb  Patient Gender: F                  BSA:          1.49 m??    Indications: Chest Pain  Sonographer:    Sherrie Sport RDCS  Referring Phys: Glendon Axe    Summary:   1. Left ventricular ejection fraction, by visual estimation, is 60 to   65%.   2. Normal global left ventricular systolic function.   3. Normal right ventricular size and systolic function.   4. Mild to moderate mitral valve regurgitation.   5. Moderate tricuspid regurgitation.   6. Aortic valve scerlosis without significant stenosis   7. High normal pulmonary artery pressures  2D AND M-MODE MEASUREMENTS (normal ranges within parentheses):  Left Ventricle:          Normal  IVSd (2D):      0.75 cm (0.7-1.1)  LVPWd (2D):     0.95 cm (0.7-1.1) Aorta/LA:                  Normal  LVIDd (2D):     4.39 cm (3.4-5.7) Aortic Root (2D): 2.70 cm (2.4-3.7)  LVIDs (2D):     2.38 cm           Left Atrium (2D): 3.00 cm (1.9-4.0)  LV FS (2D):     45.8 %   (>25%)  LV EF (2D):     77.4 %   (>50%)                                    Right Ventricle:                                    RVd (2D):        4.32 cm  LV DIASTOLIC FUNCTION:  MV Peak E: 0.93 m/s E/e' Ratio: 18.80  MV Peak A: 1.31 m/s Decel Time: 222 msec  E/A Ratio: 0.71  SPECTRAL DOPPLER ANALYSIS (where applicable):  Mitral Valve:  MV P1/2 Time: 64.38 msec  MV Area, PHT: 3.42 cm??  Aortic Valve: AoV Max Vel: 1.73 m/s AoV Peak PG: 12.0 mmHg AoV Mean PG:     6.7 mmHg  LVOT Vmax: 0.92 m/s LVOT VTI:  0.263 m LVOT Diameter: 2.00 cm  AoV Area, Vmax: 1.67 cm?? AoV Area, VTI: 1.99 cm?? AoV Area, Vmn: 1.56 cm??  Tricuspid Valve and PA/RV Systolic Pressure: TR Max Velocity: 2.53 m/s RA   Pressure: 5 mmHg RVSP/PASP: 30.6 mmHg  Pulmonic Valve:  PV Max Velocity: 0.79 m/s PV Max PG: 2.5 mmHg PV Mean PG:    PHYSICIAN INTERPRETATION:  Left Ventricle: The left ventricular internal cavity size was normal. LV   posterior wall thickness was normal. No left ventricular hypertrophy.  Global LV systolic function was normal. Left ventricular ejection   fraction, by visual estimation, is 60 to 65%. Spectral Doppler shows   normal pattern of LV diastolic filling.  Right Ventricle: Normal right ventricular size, wall thickness, and     systolic function. The right ventricular size is normal. Global RV   systolic function is normal.  Left Atrium: The left atrium is normal in size.  Right Atrium: The right atrium is normal in size.  Pericardium: There is no evidence of pericardial effusion.  Mitral Valve: The mitral valve is normal in structure. Mild to moderate   mitral valve regurgitation is seen. MAC noted.  Tricuspid Valve: The tricuspid valve is normal. Moderate tricuspid   regurgitation is visualized. The tricuspid regurgitant velocity is 2.53   m/s, and with an assumed right atrial pressure of 5 mmHg, the estimated   right ventricular systolic pressure is normal at 30.6 mmHg.  Aortic Valve: The aortic valve is normal. Mild to moderate aortic valve   sclerosis/calcification is present, without any evidence of aortic   stenosis.  [id number] Ida Rogue MD  Electronically signed by [id number] Ida Rogue MD  Signature Date/Time: 09/20/2014/10:11:39 AM    *** Final ***    IMPRESSION: .        Verified By: Minna Merritts, M.D., MD    Bee Stings: Swelling, Other  Codeine: Unknown  Sulfa drugs: Unknown  Lipitor: Unknown  Evista: Unknown  Morphine: Unknown  Meloxicam: Unknown  Tretinoin:  Unknown  Vital Signs/Nurse's Notes: **Vital Signs.:   06-Apr-16 05:20  Vital Signs Type Routine  Temperature Temperature (F) 98.2  Celsius 36.7  Pulse Pulse 61  Respirations Respirations 16  Systolic BP Systolic BP 956  Diastolic BP (mmHg) Diastolic BP (mmHg) 47  Mean BP 70  Pulse Ox % Pulse Ox % 98  Pulse Ox Activity Level  At rest  Oxygen Delivery Room Air/ 21 %    Impression 81 year old female with history of HTN, stroke/TIA,  history of breast cancer status post lumpectomy in 2013, CKD stage II-III, and anemia of chronic disease who presented to [facility] on 09/20/2014 with a one day history of chest pain while washing the dishes.   1. Chest pain, concerning for possible unstable angina vs reflux: -Troponin negative x 2 so far -Schedule for Lexiscan Myoview later today -She has been having intermittent episodes of chest pain over the past two weeks requiring her to take aspirin 81 mg frequently (she previously took aspirin 81 mg for episodes of chest pain) increasing the possibility of worsening reflux -If Lexiscan is normal could consider discharging her with long acting nitro such as Imdur 30 mg daily to treat possible small vessel disease,  will aid in treating her blood pressure as well -Echo pending to evaluate LV function and wall motion  2. HTN: -Improved from admission -Consider adding Imdur as above -Continue current medications for now until Lexiscan is back  3. GERD: -Has been taking increased aspirin 2/2 #1 -PPI -Has history of hiatal hernia  4. CKD stage II-III: -Stable -Monitor  5. Anemia of chronic disease: -Per IM -Monitor  6. History of stroke/TIA  7. History of breast cancer s/p lumpectomy in 2013   Electronic Signatures: Rise Mu (PA-C)  (Signed 06-Apr-16 10:12)  Authored: General Aspect/Present Illness, History and Physical Exam, Review of System, Family & Social History, Past Medical History, Home Medications, Labs, EKG , Radiology, Allergies,  Vital Signs/Nurse's Notes, Impression/Plan Ida Rogue (MD)  (Signed 06-Apr-16  19:14)  Authored: General Aspect/Present Illness, History and Physical Exam, Review of System, Family & Social History, Health Issues, Labs, EKG , Radiology, Impression/Plan  Co-Signer: General Aspect/Present Illness, History and Physical Exam, Review of System, Family & Social History, Past Medical History, Home Medications, Labs, EKG , Radiology, Allergies, Vital Signs/Nurse's Notes, Impression/Plan   Last Updated: 06-Apr-16 19:34 by Ida Rogue (MD)

## 2014-10-15 NOTE — Discharge Summary (Signed)
Dates of Admission and Diagnosis:  Date of Admission 20-Sep-2014   Date of Discharge 21-Sep-2014   Admitting Diagnosis Chest pain, Hypertension, h/o TIA, Stage 2 CKD and GERD   Final Diagnosis Chest pain, MI ruled out   Discharge Diagnosis 1 Chest pain, MI ruled out   2 Hypertension, h/o TIA, Stage 2 CKD and GERD    Chief Complaint/History of Present Illness 81 year old lady with h/o HTN, TIA, stage 2-3 CKD and GERD presented to the ED with chest pain. Refer to [place] for details.   Allergies:  Bee Stings: Swelling, Other  Codeine: Unknown  Sulfa drugs: Unknown  Lipitor: Unknown  Evista: Unknown  Morphine: Unknown  Meloxicam: Unknown  Tretinoin: Unknown    LabObservation:  06-Apr-16 08:24   OBSERVATION Reason for Test  Hepatic:  06-Apr-16 00:25   Bilirubin, Total  0.2 (0.3-1.2 NOTE: New Reference Range  08/22/14)  Bilirubin, Direct 0.1 (0.1-0.5 NOTE: New Reference Range  08/22/14)  Alkaline Phosphatase 70 (38-126 NOTE: New Reference Range  08/22/14)  SGPT (ALT)  13 (14-54 NOTE: New Reference Range  08/22/14)  SGOT (AST) 18 (15-41 NOTE: New Reference Range  08/22/14)  Total Protein, Serum  6.4 (6.5-8.1 NOTE: New Reference Range  08/22/14)  Albumin, Serum 4.1 (3.5-5.0 NOTE: New reference range  08/22/14)  Cardiology:  06-Apr-16 00:25   ECG interpretation Normal sinus rhythm Normal ECG When compared with ECG of 09-Jul-2013 20:31, Criteria for Septal infarct are no longer Present ----------unconfirmed---------- Confirmed by OVERREAD, NOT ([id number]), editor PEARSON, BARBARA ([id number]) on 09/20/2014 9:04:15 AM  Ventricular Rate 66  Atrial Rate 66  P-R Interval 136  QRS Duration 72  QT 402  QTc 421  P Axis 54  R Axis -22  T Axis 59    08:24   Echo Doppler REASON FOR EXAM:     COMMENTS:     PROCEDURE: [facility] - ECHO DOPPLER COMPLETE(TRANSTHOR)  - Sep 20 2014  8:24AM   RESULT: Echocardiogram Report  Patient Name:   Rachel Reynolds Date of Exam: 09/20/2014 Medical  Rec #:  [id number]             Custom1: Date of Birth:  02-Jan-1927          Height:       64.0 in Patient Age:    89 years           Weight:       106.0 lb Patient Gender: F                  BSA:          1.49 m??  Indications: Chest Pain Sonographer:    Sherrie Sport RDCS Referring Phys: Glendon Axe  Summary:  1. Left ventricular ejection fraction, by visual estimation, is 60 to  65%.  2. Normal global left ventricular systolic function.  3. Normal right ventricular size and systolic function.  4. Mild to moderate mitral valve regurgitation.  5. Moderate tricuspid regurgitation.  6. Aortic valve scerlosis without significant stenosis  7. High normal pulmonary artery pressures 2D AND M-MODE MEASUREMENTS (normal ranges within parentheses): Left Ventricle:          Normal IVSd (2D):      0.75 cm (0.7-1.1) LVPWd (2D):     0.95 cm (0.7-1.1) Aorta/LA:                  Normal LVIDd (2D):     4.39 cm (3.4-5.7) Aortic Root (2D): 2.70  cm (2.4-3.7) LVIDs (2D):     2.38 cm           Left Atrium (2D): 3.00 cm (1.9-4.0) LV FS (2D):     45.8 %   (>25%) LV EF (2D):     77.4 %   (>50%)                                   Right Ventricle:                                   RVd (2D):        2.95 cm LV DIASTOLIC FUNCTION: MV Peak E: 0.93 m/s E/e' Ratio: 18.80 MV Peak A: 1.31 m/s Decel Time: 222 msec E/A Ratio: 0.71 SPECTRAL DOPPLER ANALYSIS (where applicable): Mitral Valve: MV P1/2 Time: 64.38 msec MV Area, PHT: 3.42 cm?? Aortic Valve: AoV Max Vel: 1.73 m/s AoV Peak PG: 12.0 mmHg AoV Mean PG:   6.7 mmHg LVOT Vmax: 0.92 m/s LVOT VTI: 0.263 m LVOT Diameter: 2.00 cm AoV Area, Vmax: 1.67 cm?? AoV Area, VTI: 1.99 cm?? AoV Area, Vmn: 1.56 cm?? Tricuspid Valve and PA/RV Systolic Pressure: TR Max Velocity: 2.53 m/s RA  Pressure: 5 mmHg RVSP/PASP: 30.6 mmHg Pulmonic Valve: PV Max Velocity: 0.79 m/s PV Max PG: 2.5 mmHg PV Mean PG:  PHYSICIAN INTERPRETATION: Left Ventricle: The left ventricular internal  cavity size was normal. LV  posterior wall thickness was normal. No left ventricular hypertrophy.  Global LV systolic function was normal. Left ventricular ejection  fraction, by visual estimation, is 60 to 65%. Spectral Doppler shows  normal pattern of LV diastolic filling. Right Ventricle: Normal right ventricular size, wall thickness, and   systolic function. The right ventricular size is normal. Global RV  systolic function is normal. Left Atrium: The left atrium is normal in size. Right Atrium: The right atrium is normal in size. Pericardium: There is no evidence of pericardial effusion. Mitral Valve: The mitral valve is normal in structure. Mild to moderate  mitral valve regurgitation is seen. MAC noted. Tricuspid Valve: The tricuspid valve is normal. Moderate tricuspid  regurgitation is visualized. The tricuspid regurgitant velocity is 2.53  m/s, and with an assumed right atrial pressure of 5 mmHg, the estimated  right ventricular systolic pressure is normal at 30.6 mmHg. Aortic Valve: The aortic valve is normal. Mild to moderate aortic valve  sclerosis/calcification is present, without any evidence of aortic  stenosis. [id number] Ida Rogue MD Electronically signed by [id number] Ida Rogue MD Signature Date/Time: 09/20/2014/10:11:39 AM  *** Final ***  IMPRESSION: .    Verified By: Minna Merritts, M.D., MD    11:16   Protocol Harriet Pho Work Load 10  Total Exercise Time 61  Max Diastolic BP 61  Max Heart Rate 101  Max Predicted Heart Rate 135  Reason For Termination per lexiscan protocol  ECG interpretation Confirmed by OVERREAD, NOT ([id number]), editor PEARSON, BARBARA ([id number]) on 09/20/2014 1:36:38 PM  Routine Chem:  06-Apr-16 00:25   Glucose, Serum  138 (65-99 NOTE: New Reference Range  08/22/14)  BUN  37 (6-20 NOTE: New Reference Range  08/22/14)  Creatinine (comp)  1.18 (0.44-1.00 NOTE: New Reference Range  08/22/14)  Sodium, Serum 137 (135-145 NOTE: New  Reference Range  08/22/14)  Potassium, Serum 3.6 (3.5-5.1 NOTE: New Reference Range  08/22/14)  Chloride, Serum  102 (101-111 NOTE: New Reference Range  08/22/14)  CO2, Serum 26 (22-32 NOTE: New Reference Range  08/22/14)  Calcium (Total), Serum 9.0 (8.9-10.3 NOTE: New Reference Range  08/22/14)  Anion Gap 9  eGFR (African American)  49  eGFR (Non-African American)  42 (eGFR values <25m/min/1.73 m2 may be an indication of chronic kidney disease (CKD). Calculated eGFR is useful in patients with stable renal function. The eGFR calculation will not be reliable in acutely ill patients when serum creatinine is changing rapidly. It is not useful in patients on dialysis. The eGFR calculation may not be applicable to patients at the low and high extremes of body sizes, pregnant women, and vegetarians.)  Iron Binding Capacity (TIBC) 358  Unbound Iron Binding Capacity 313.7  Iron, Serum 44 (28-170 NOTE: New Reference Range:  08/22/14)  Iron Saturation 12.3 (Result(s) reported on 20 Sep 2014 at 03:30AM.)  Ferritin ([facility] 50 (11-307 NOTE: New Reference Range  08/22/14)  Bilirubin, Indirect 0.1 (Result(s) reported on 20 Sep 2014 at 03:30AM.)  B-Type Natriuretic Peptide ([facility] 66 (0-99 NOTE: New Reference Range:  08/22/14)  Cardiac:  06-Apr-16 00:25   Troponin I <0.03 (0.00-0.03 0.03 ng/mL or less: NEGATIVE  Repeat testing in 3-6 hrs  if clinically indicated. >0.05 ng/mL: POTENTIAL  MYOCARDIAL INJURY. Repeat  testing in 3-6 hrs if  clinically indicated. NOTE: An increase or decrease  of 30% or more on serial  testing suggests a  clinically important change NOTE: New Reference Range  08/22/14)    04:31   Troponin I <0.03 (0.00-0.03 0.03 ng/mL or less: NEGATIVE  Repeat testing in 3-6 hrs  if clinically indicated. >0.05 ng/mL: POTENTIAL  MYOCARDIAL INJURY. Repeat  testing in 3-6 hrs if  clinically indicated. NOTE: An increase or decrease  of 30% or more on serial   testing suggests a  clinically important change NOTE: New Reference Range  08/22/14)    10:00   Troponin I <0.03 (0.00-0.03 0.03 ng/mL or less: NEGATIVE  Repeat testing in 3-6 hrs  if clinically indicated. >0.05 ng/mL: POTENTIAL  MYOCARDIAL INJURY. Repeat  testing in 3-6 hrs if  clinically indicated. NOTE: An increase or decrease  of 30% or more on serial  testing suggests a  clinically important change NOTE: New Reference Range  08/22/14)  Routine Hem:  06-Apr-16 00:25   WBC (CBC) 7.8  RBC (CBC)  3.33  Hemoglobin (CBC)  10.4  Hematocrit (CBC)  31.6  Platelet Count (CBC) 162 (Result(s) reported on 20 Sep 2014 at 12:52AM.)  MCV 95  MCH 31.3  MCHC 33.0  RDW 13.4   PERTINENT RADIOLOGY STUDIES: XRay:    06-Apr-16 02:35, Chest PA and Lateral  Chest PA and Lateral   REASON FOR EXAM:    chest pain  COMMENTS:       PROCEDURE: DXR - DXR CHEST PA (OR AP) AND LATERAL  - Sep 20 2014  2:35AM     CLINICAL DATA:  Mid chest pain since 7 p.m..  Weakness.    EXAM:  CHEST  2 VIEW    COMPARISON:  07/09/2013    FINDINGS:  No cardiomegaly. There is stable mild aortic tortuosity. The hila  are negative. There is no edema, consolidation, effusion, or  pneumothorax. A rounded density overlapping the peripheral upper  right chest is an EKG pad. Chronic mild wedging of a mid thoracic  vertebral body, likely T6. No acute osseous findings.     IMPRESSION:  No active cardiopulmonary disease.  Electronically Signed    By: Monte Fantasia M.D.    On: 09/20/2014 02:42         Verified By: Gilford Silvius, M.D.,   Pertinent Past History:  Pertinent Past History Hypertension  TIA Stage 2-3 CKD GERD   Hospital Course:  Hospital Course 81 year old lady with h/o HTN, TIA, stage 2-3 CKD and GERD. Patient was admitted for chest pain. Patient is received Aspirin, s/q heparin and nitrates.Patient was started on low dose Metoprolol 12.5 mg BID. Hold for HR <60.  Serial troponins  were negative. Patient was unable to tolerate statins in the past. ECHO revealed normal EF, mild to moderate MR and TR. Hypertension remained well controlled. Stage 2 CKD remained stable. Patient received GI prophylaxis with Pantoprazole. Lexiscan Myoview was reported negative for ischemia. Patient had EKG changed which were felt to be due to vasospasm. Patient was advised to take s/l NTG as per Cardiology. No further cardiac work up planned.   Condition on Discharge Satisfactory   DISCHARGE INSTRUCTIONS HOME MEDS:  Medication Reconciliation: Patient's Home Medications at Discharge:     Medication Instructions  aspirin 81 mg oral tablet  1 tab(s) orally once a day   calcium carbonate 600 mg oral tablet  1 tab(s) orally 2 times a day (with meals)   enalapril 5 mg oral tablet  1 tab(s) orally once a day   acetaminophen 325 mg oral tablet  2 tab(s) orally every 6 hours, As Needed - for Pain   nitroglycerin 0.4 mg sublingual tablet  1 tab(s) sublingual , As Needed, chest pain , As needed, chest pain   metoprolol  12.5 milligram(s) orally every 12 hours    PRESCRIPTIONS: PRINTED AND GIVEN TO PATIENT/FAMILY  STOP TAKING THE FOLLOWING MEDICATION(S):    potassium chloride 10 meq oral tablet, extended release: 1 tab(s) orally once a day lomotil 0.025 mg-2.5 mg oral tablet: 1 tab(s) orally as needed for diarrhea and loose stools hydrochlorothiazide: 37.5  orally once a day  Physician's Instructions:  Diet Low Sodium  Low Fat, Low Cholesterol   Activity Limitations As tolerated   Return to Work Not Applicable   Time frame for Follow Up Appointment 1-2 weeks  Dr. Glendon Axe   Time frame for Follow Up Appointment Dr. Ronne Binning, Andraya Frigon(Attending Physician): [facility], [address] [place], [place] [address], [place], Timothy(Consultant): [place], [address], [place], [place] [address], [place] [phone]  TIME  SPENT:  Total Time: Greater than 30 minutes   Electronic Signatures: Glendon Axe (MD)  (Signed 07-Apr-16 13:35)  Authored: ADMISSION DATE AND DIAGNOSIS, CHIEF COMPLAINT/HPI, Allergies, PERTINENT LABS, PERTINENT RADIOLOGY STUDIES, PERTINENT PAST HISTORY, HOSPITAL COURSE, DISCHARGE INSTRUCTIONS HOME MEDS, PATIENT INSTRUCTIONS, Follow Up Physician, TIME SPENT   Last Updated: 07-Apr-16 13:35 by Glendon Axe (MD)

## 2014-10-15 NOTE — H&P (Signed)
PATIENT NAME:  Rachel Reynolds, Rachel Reynolds MR#:  [id number] DATE OF BIRTH:  18-May-1927  DATE OF ADMISSION:  09/20/2014  REFERRING PHYSICIAN: Valli Glance. Owens Shark, MD   PRIMARY CARE PRACTITIONER: Previously, Deborra Medina, MD, and currently Glendon Axe, MD, of [facility].   ADMITTING PHYSICIAN: Juluis Mire, MD    CHIEF COMPLAINT: Retrosternal chest discomfort.   HISTORY OF PRESENT ILLNESS: An 81 year old Caucasian female with a past medical history of hypertension, CVA/TIA, history of breast cancer status post lumpectomy, chronic anemia, a resident of [facility], presents with the complaints of retrosternal chest discomfort. The patient stated around 7:30 p.m. 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 Hence, she called the nurse on duty who evaluated the patient and found the patient with elevated blood pressure, and EMS was called on site. EMS evaluated the patient and brought her to the Emergency Room for further evaluation. En route she received nitroglycerin spray x 2, which was administered by EMS, following which her chest pain resolved completely. The patient currently remains chest pain-free and does not have any other symptoms. Denies any radiation of pain. No associated shortness of breath, diaphoresis, nausea, vomiting, diarrhea, palpitations, dizziness, or loss of consciousness. No history of any recent illnesses, fever, cough. No similar episodes in the past. The patient stated that she had a cardiac catheterization done in the remote past and does not know the results. She did have a stress test done in 2013, which was negative for any acute ischemia. The patient also took 4 aspirin tablets while she was at home.   PAST MEDICAL HISTORY:  1.  Hypertension.  2.  CVA/TIA in 2001.  3.  Breast cancer status post lumpectomy.  4.  Chronic anemia.   PAST  SURGICAL HISTORY:  1.  Bladder surgery.  2.  Bilateral cataract surgery.  3.  Hysterectomy.  4.  Appendectomy.  5.  Breast mass lumpectomy.   ALLERGIES:  1.  BEE STING.  2.  CODEINE.  3.  SULFA.  4.  LIPITOR.  5.  EVISTA.  6.  MORPHINE.  7.  MELOXICAM.  8.  TRETINOIN.   FAMILY HISTORY: Father died of old age at 9 years of age and mom died of complication from cerebral aneurysm and son with a history of diabetes mellitus.   SOCIAL HISTORY: She is widowed, lives at [facility] by herself. No history of any smoking, alcohol, or substance abuse.   HOME MEDICATIONS:  1.  Acetaminophen 325 mg 2 tablets every 6 hours as needed for pain.  2.  Aspirin 81 mg 1 tablet orally once a day.  3.  Calcium carbonate 600 mg 1 tablet orally 2 times a day.  4.  Cyanocobalamin 100 mcg oral tablet 1/2 tablet orally once a day.  5.  Dorzolamide/timolol ophthalmic 2% /0.5% ophthalmic solution 1 drop in left eye 2 times a day.  6.  Enalapril 5 mg 1 tablet orally once a day.  7.  Latanoprost ophthalmic solution 0.005 at 1 drop to the left eye once a day at bedtime.  8.  Lomotil 0.25 mg/2.5 mg oral tablet 1 tablet orally as needed for diarrhea.  9.  Omeprazole 20 mg 1 capsule orally once a day.  10.  Potassium chloride 10 mEq oral tablet 1 tablet orally once a day.   REVIEW OF SYSTEMS:  CONSTITUTIONAL: Negative for fever, chills. No fatigue. No generalized  weakness.  EYES: Negative for blurred vision, double vision. No pain. No redness. No discharge.  EARS, NOSE, AND THROAT: Negative for tinnitus, ear pain. Positive for impaired hearing uses hearing aid.  RESPIRATORY: Negative for cough, wheezing, dyspnea, hemoptysis, or painful respiration.  CARDIOVASCULAR: Positive for retrosternal chest discomfort as noted in the history of present illness, which resolved with the nitroglycerin sublingual spray. Negative for palpitations, dizziness, syncopal episodes, orthopnea, dyspnea on exertion,  pedal edema.  GASTROINTESTINAL: Negative for nausea, vomiting, diarrhea, abdominal pain, hematemesis, melena, rectal bleeding.  GENITOURINARY: Negative for dysuria, frequency, urgency, hematuria.  ENDOCRINE: Negative for polyuria, nocturia, heat or cold intolerance.  HEMATOLOGIC AND LYMPHATIC: Negative for easy bruising, bleeding. Positive for chronic anemia.  INTEGUMENTARY: Negative for acne, skin rash, or lesions.  MUSCULOSKELETAL: Negative for neck or back pain. No history of arthritis or gout.  NEUROLOGICAL: Negative for focal weakness or numbness. History of CVA/TIA in the past.   PSYCHIATRIC: Negative for anxiety, insomnia, depression.   PHYSICAL EXAMINATION:  VITAL SIGNS: Temperature 98.2 degrees Fahrenheit, pulse rate 67 per minute, respirations 20 per minute, blood pressure on arrival 173/72, current blood pressure 155/92, oxygen saturation 98% on room air.  GENERAL: Well developed, well nourished, alert, and oriented, in no acute distress, comfortably resting in the bed.  HEAD: Atraumatic, normocephalic.  EYES: Pupils equal, react to light and accommodation. Conjunctival pallor present. No icterus. Extraocular movements intact.  NOSE: No drainage. No lesions.  EARS: No drainage. No external lesions.  ORAL CAVITY: No mucosal lesions. No exudates.  NECK: Supple. No JVD. No thyromegaly. No carotid bruit. Range of motion neck within normal limits.  RESPIRATORY: Good respiratory effort. Not using accessory muscles of respiration. Bilateral vesicular breath sounds and no rales or rhonchi.  CARDIOVASCULAR: S1, S2 regular. No murmurs, gallops, or clicks. Pulses equal at carotid, femoral,  and pedal pulses. No peripheral edema.  GASTROINTESTINAL: Abdomen soft, nontender. No hepatosplenomegaly. No masses. No rigidity. No guarding. Bowel sounds present and equal in all 4 quadrants.  GENITOURINARY: Deferred.  MUSCULOSKELETAL: No joint tenderness or effusion. Range of motion adequate. Strength  and tone equal bilaterally.  SKIN: Inspection within normal limits. No obvious wounds.  LYMPHATIC: No cervical lymphadenopathy.  VASCULAR: Good dorsalis pedis and posterior tibial pulses.  NEUROLOGICAL: Alert, awake, and oriented x 3. Cranial nerves II-XII grossly intact. No sensory deficit. Motor strength 5/5 in both upper and lower extremity. DTRs 2+ bilateral, symmetrical. Plantars downgoing.  PSYCHIATRIC: Alert, awake, and oriented x 3. Judgment and insight adequate. Memory and mood within normal limits.   ANCILLARY DATA:  LABORATORY DATA: Serum glucose 138, BUN 37, creatinine 1.18, sodium 137, potassium 3.6, chloride 102, bicarbonate 26, total calcium 9.0. Troponin less than 0.03. WBC 7.8, hemoglobin 10.4, hematocrit 31.7, platelet count 162,000.   CHEST X-RAY: Not done at this time.   EKG: Normal sinus rhythm with ventricular rate of 66 beats per minute, no acute ST-T changes.   ASSESSMENT AND PLAN: An 81 year old Caucasian female, a resident of [place] with past medical history of hypertension, cerebrovascular accident/transient ischemic attack, chronic anemia, breast cancer status post lumpectomy, presents with retrosternal chest pain, which is relieved with nitroglycerin spray, currently chest pain-free. Troponin x 1 negative. EKG sinus bradycardia with no acute ST-T changes.  1.  Episode of chest pain, resolved with nitroglycerin spray. Troponin x 1 negative. EKG no acute changes. The patient is currently chest pain-free stable clinically. Rule out acute coronary event.  Plan: Admit to telemetry for observation. Cycle cardiac  enzymes. Continue aspirin and nitroglycerin, p.r.n. We will request echocardiogram and cardiology consultation for further evaluation. We will not start beta blocker because of low heart rate, which is around 60 on cardiac monitor at this time.  2.  Hypertension on home medications. Continue same.  3.  Chronic anemia, cause not known,  hemoglobin and hematocrit mildly low. The patient is stable clinically. Plan: Monitor. We obtain stool for occult blood and order iron and TIBC and ferritin studies and follow up accordingly.  4.  Renal insufficiency, which is mild. History of chronic kidney disease in the past, per old chart. Creatinine currently mild elevation. Monitor for now and monitor BMP.  5.  History of transient ischemic attack/cerebrovascular accident, stable clinically. Monitor.  6.  Deep vein thrombosis prophylaxis. Subcutaneous heparin.  7.  Gastrointestinal prophylaxis. Proton pump inhibitor.   CODE STATUS: Full code.   TIME SPENT: 50 minutes.    ____________________________ Juluis Mire, MD enr:bm D: 09/20/2014 02:28:09 ET T: 09/20/2014 04:14:33 ET JOB#: [id number]  cc: Juluis Mire, MD, <Dictator> Glendon Axe, MD Juluis Mire MD ELECTRONICALLY SIGNED 09/21/2014 17:50

## 2015-01-09 ENCOUNTER — Ambulatory Visit
Admission: RE | Admit: 2015-01-09 | Discharge: 2015-01-09 | Disposition: A | Discharge status: Home / Self Care | Payer: PPO | Source: Ambulatory Visit | Attending: Internal Medicine | Admitting: Internal Medicine

## 2015-01-09 ENCOUNTER — Other Ambulatory Visit: Payer: Self-pay | Admitting: Internal Medicine

## 2015-01-09 DIAGNOSIS — R1084 Generalized abdominal pain: Secondary | ICD-10-CM | POA: Diagnosis present

## 2015-01-09 DIAGNOSIS — R935 Abnormal findings on diagnostic imaging of other abdominal regions, including retroperitoneum: Secondary | ICD-10-CM | POA: Diagnosis not present

## 2015-01-10 ENCOUNTER — Other Ambulatory Visit: Payer: Self-pay | Admitting: Internal Medicine

## 2015-01-10 DIAGNOSIS — R911 Solitary pulmonary nodule: Secondary | ICD-10-CM

## 2015-01-10 DIAGNOSIS — R918 Other nonspecific abnormal finding of lung field: Secondary | ICD-10-CM

## 2015-01-12 ENCOUNTER — Ambulatory Visit
Admission: RE | Admit: 2015-01-12 | Discharge: 2015-01-12 | Disposition: A | Discharge status: Home / Self Care | Payer: PPO | Source: Ambulatory Visit | Attending: Internal Medicine | Admitting: Internal Medicine

## 2015-01-12 ENCOUNTER — Other Ambulatory Visit: Payer: Self-pay | Admitting: Internal Medicine

## 2015-01-12 DIAGNOSIS — R911 Solitary pulmonary nodule: Secondary | ICD-10-CM | POA: Insufficient documentation

## 2015-01-12 DIAGNOSIS — R918 Other nonspecific abnormal finding of lung field: Secondary | ICD-10-CM

## 2015-01-16 ENCOUNTER — Other Ambulatory Visit: Payer: Self-pay | Admitting: Internal Medicine

## 2015-01-16 DIAGNOSIS — R222 Localized swelling, mass and lump, trunk: Secondary | ICD-10-CM

## 2015-02-22 ENCOUNTER — Other Ambulatory Visit: Payer: Self-pay | Admitting: Internal Medicine

## 2015-02-22 DIAGNOSIS — F0280 Dementia in other diseases classified elsewhere without behavioral disturbance: Secondary | ICD-10-CM

## 2015-02-22 DIAGNOSIS — G301 Alzheimer's disease with late onset: Principal | ICD-10-CM

## 2015-03-02 ENCOUNTER — Ambulatory Visit
Admission: RE | Admit: 2015-03-02 | Discharge: 2015-03-02 | Disposition: A | Discharge status: Home / Self Care | Payer: PPO | Source: Ambulatory Visit | Attending: Internal Medicine | Admitting: Internal Medicine

## 2015-03-02 DIAGNOSIS — R911 Solitary pulmonary nodule: Secondary | ICD-10-CM | POA: Diagnosis not present

## 2015-03-02 DIAGNOSIS — G319 Degenerative disease of nervous system, unspecified: Secondary | ICD-10-CM | POA: Insufficient documentation

## 2015-03-02 DIAGNOSIS — R222 Localized swelling, mass and lump, trunk: Secondary | ICD-10-CM

## 2015-03-02 DIAGNOSIS — R413 Other amnesia: Secondary | ICD-10-CM | POA: Insufficient documentation

## 2015-03-02 DIAGNOSIS — G301 Alzheimer's disease with late onset: Principal | ICD-10-CM

## 2015-03-02 DIAGNOSIS — F0280 Dementia in other diseases classified elsewhere without behavioral disturbance: Secondary | ICD-10-CM

## 2015-03-02 MED ORDER — IOHEXOL 300 MG/ML  SOLN
50.0000 mL | Freq: Once | INTRAMUSCULAR | Status: AC | PRN
  Administered 2015-03-02: 60 mL via INTRAVENOUS

## 2015-03-08 ENCOUNTER — Other Ambulatory Visit: Payer: Self-pay | Admitting: Specialist

## 2015-03-08 DIAGNOSIS — R918 Other nonspecific abnormal finding of lung field: Secondary | ICD-10-CM

## 2015-03-19 ENCOUNTER — Other Ambulatory Visit: Payer: Self-pay | Admitting: Surgery

## 2015-03-19 DIAGNOSIS — Z853 Personal history of malignant neoplasm of breast: Secondary | ICD-10-CM

## 2015-04-04 ENCOUNTER — Ambulatory Visit: Payer: PPO

## 2015-04-04 ENCOUNTER — Other Ambulatory Visit: Payer: Self-pay

## 2015-04-10 ENCOUNTER — Ambulatory Visit: Payer: Self-pay

## 2015-04-10 ENCOUNTER — Other Ambulatory Visit: Payer: Self-pay | Admitting: Surgery

## 2015-04-10 ENCOUNTER — Ambulatory Visit
Admission: RE | Admit: 2015-04-10 | Discharge: 2015-04-10 | Disposition: A | Discharge status: Home / Self Care | Payer: PPO | Source: Ambulatory Visit | Attending: Surgery | Admitting: Surgery

## 2015-04-10 DIAGNOSIS — Z853 Personal history of malignant neoplasm of breast: Secondary | ICD-10-CM | POA: Insufficient documentation

## 2015-04-22 IMAGING — CR DG CHEST 2V
2 series · 2 of 2 positions shown · non-contrast
Comparison: None.

CLINICAL DATA: One-week history of nonproductive cough.

EXAM:
CHEST  2 VIEW

[view not recorded (1 of 2)]
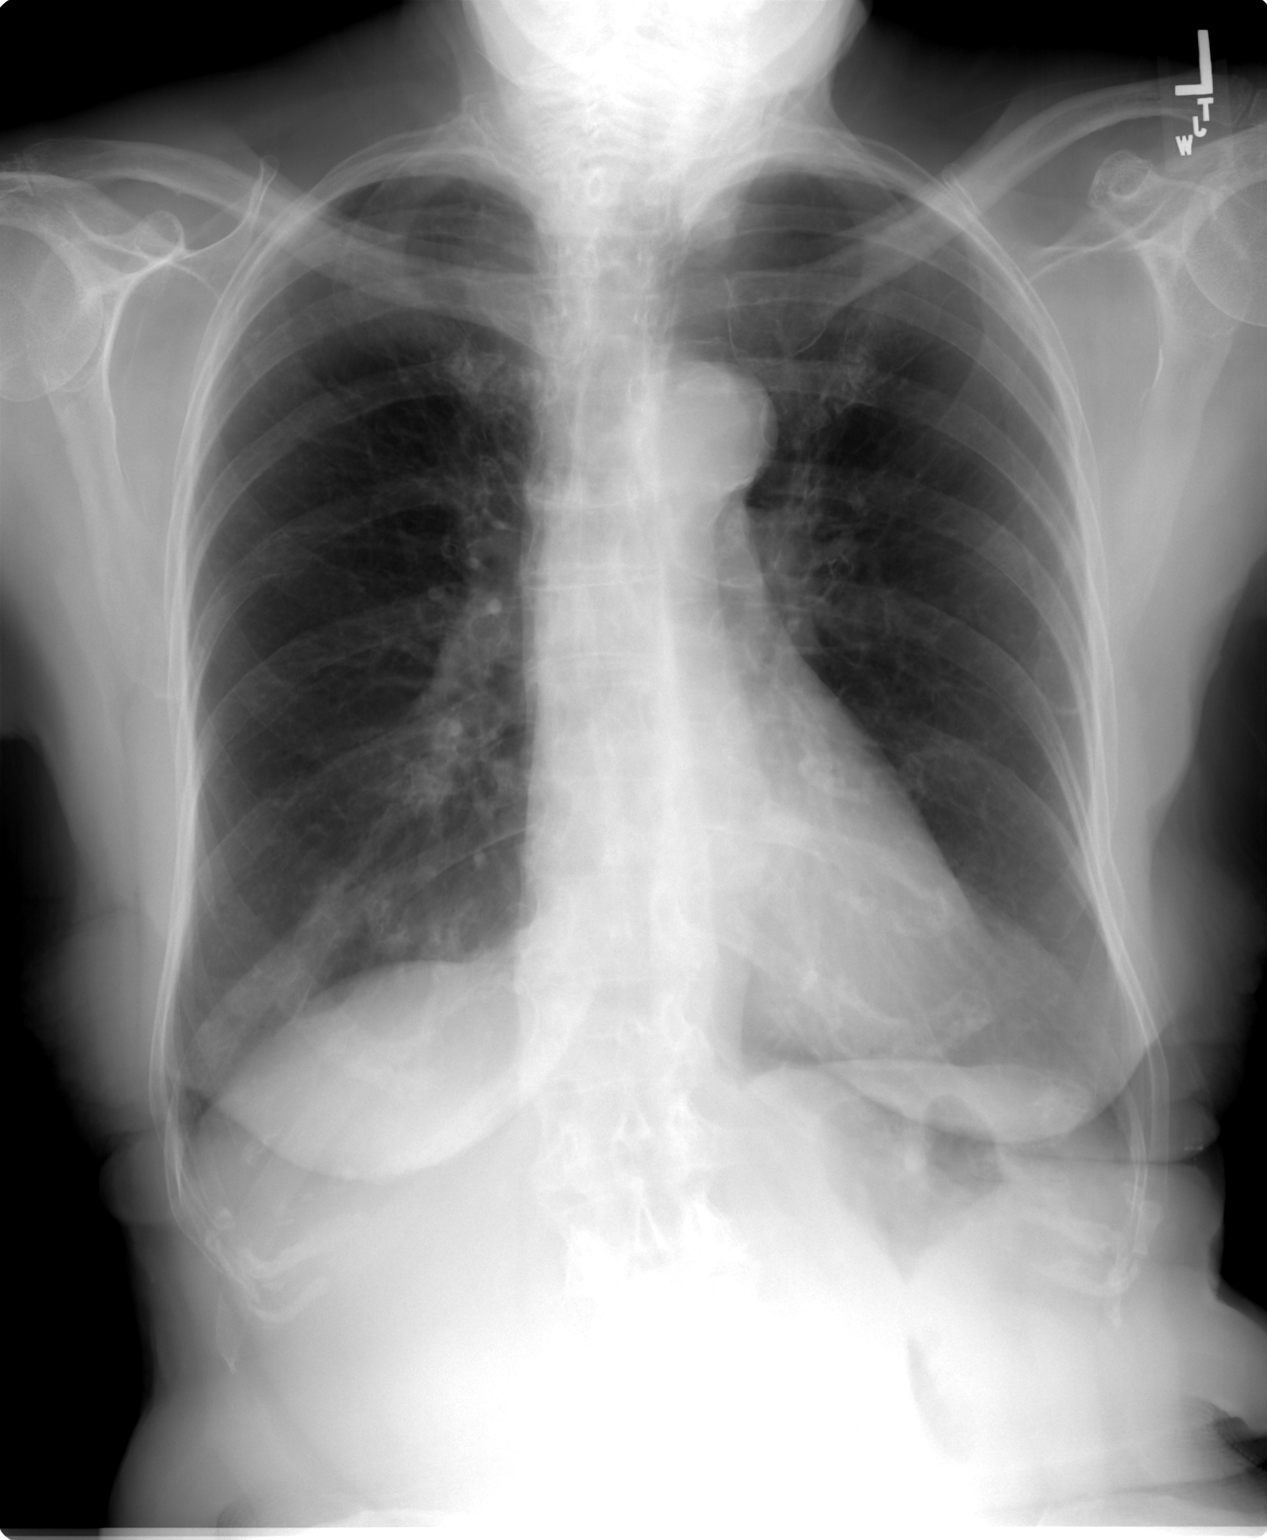

[view not recorded (2 of 2)]
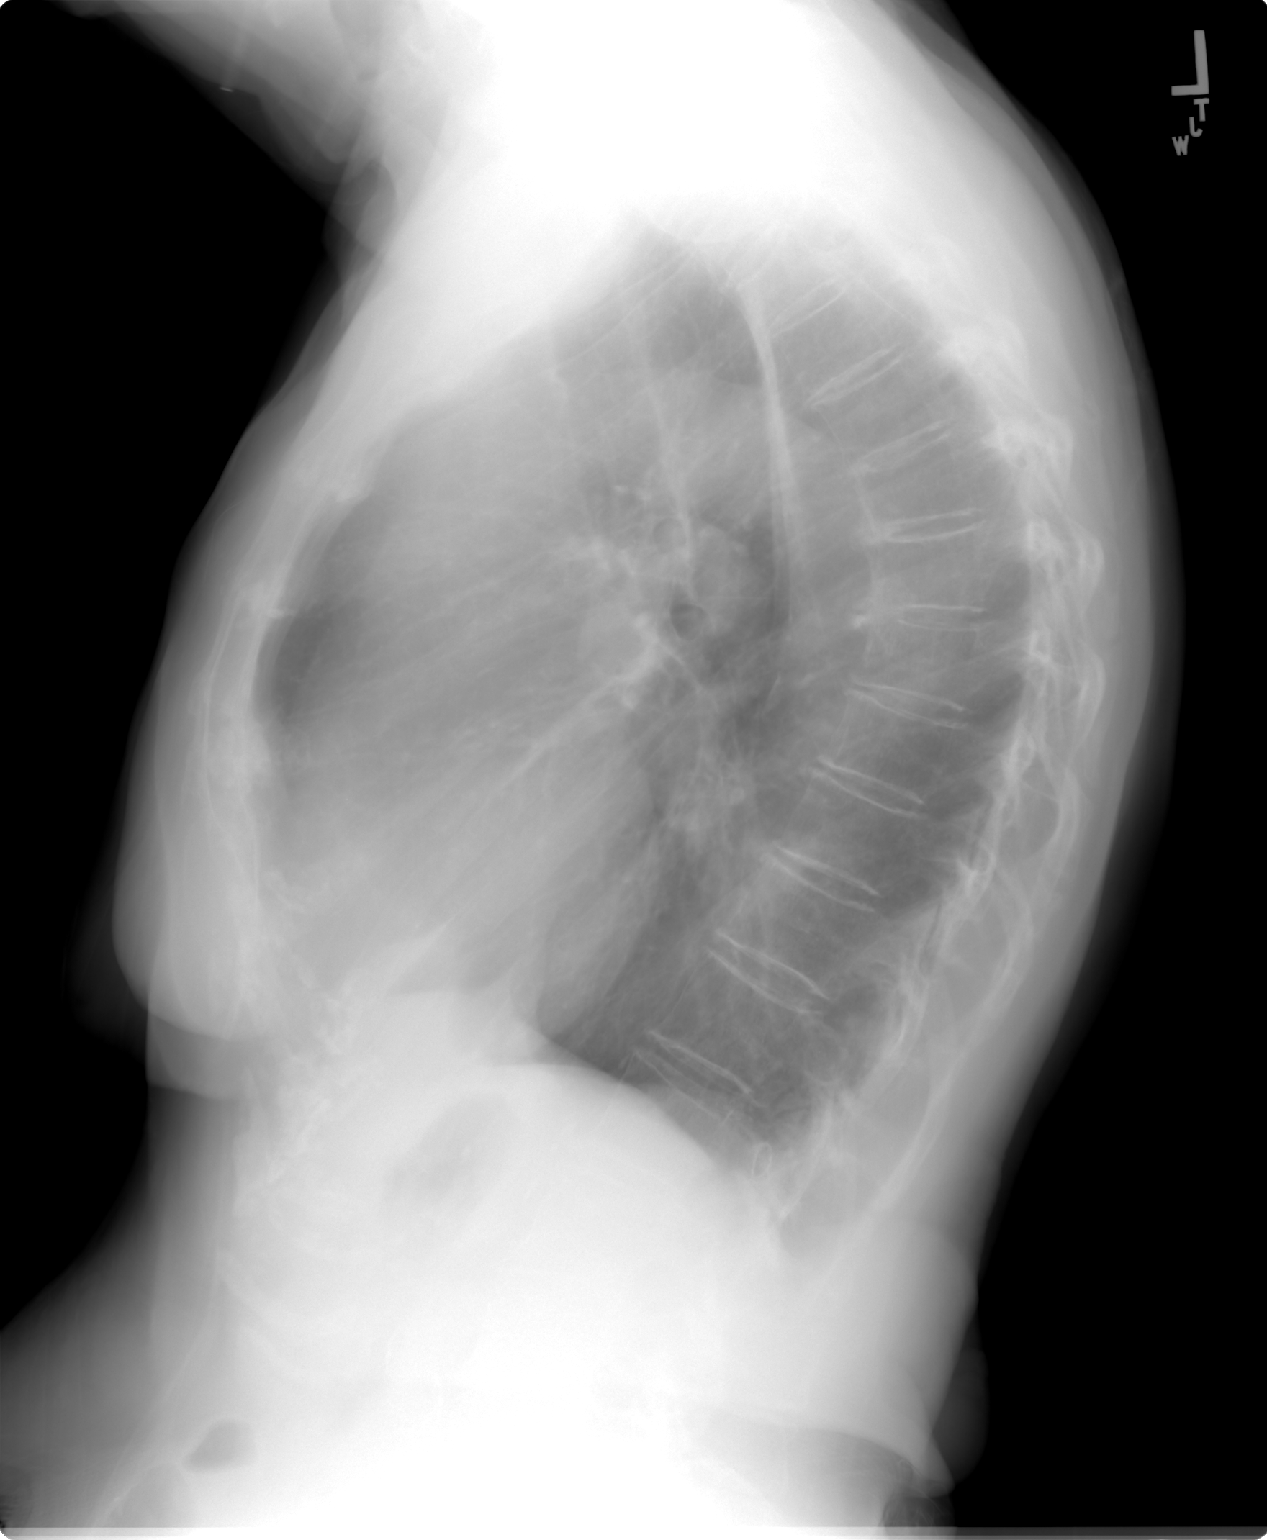

[2 of 2 positions shown; findings below may reference images not displayed]

FINDINGS: Patchy right basilar airspace disease is present. There is no loss
of the right heart border. This is compatible with right lower lobe
pneumonia. No parapneumonic effusion. Thoracic spondylosis. The
cardiopericardial silhouette appears within normal limits. Aortic
arch atherosclerosis is present.

Followup to ensure radiographic clearing and exclude an underlying
lesion is recommended. Typically clearing will be observed at 8
weeks.
IMPRESSION: Right lower lobe pneumonia.

## 2015-06-25 ENCOUNTER — Emergency Department: Payer: PPO

## 2015-06-25 ENCOUNTER — Emergency Department
Admission: EM | Admit: 2015-06-25 | Discharge: 2015-06-25 | Disposition: A | Discharge status: Home / Self Care | Payer: PPO | Attending: Emergency Medicine | Admitting: Emergency Medicine

## 2015-06-25 ENCOUNTER — Encounter: Payer: Self-pay | Admitting: Emergency Medicine

## 2015-06-25 DIAGNOSIS — S199XXA Unspecified injury of neck, initial encounter: Secondary | ICD-10-CM | POA: Insufficient documentation

## 2015-06-25 DIAGNOSIS — N183 Chronic kidney disease, stage 3 (moderate): Secondary | ICD-10-CM | POA: Insufficient documentation

## 2015-06-25 DIAGNOSIS — Y92009 Unspecified place in unspecified non-institutional (private) residence as the place of occurrence of the external cause: Secondary | ICD-10-CM | POA: Diagnosis not present

## 2015-06-25 DIAGNOSIS — Y998 Other external cause status: Secondary | ICD-10-CM | POA: Diagnosis not present

## 2015-06-25 DIAGNOSIS — I129 Hypertensive chronic kidney disease with stage 1 through stage 4 chronic kidney disease, or unspecified chronic kidney disease: Secondary | ICD-10-CM | POA: Diagnosis not present

## 2015-06-25 DIAGNOSIS — Z7982 Long term (current) use of aspirin: Secondary | ICD-10-CM | POA: Insufficient documentation

## 2015-06-25 DIAGNOSIS — W1809XA Striking against other object with subsequent fall, initial encounter: Secondary | ICD-10-CM | POA: Insufficient documentation

## 2015-06-25 DIAGNOSIS — Y9389 Activity, other specified: Secondary | ICD-10-CM | POA: Diagnosis not present

## 2015-06-25 DIAGNOSIS — S0993XA Unspecified injury of face, initial encounter: Secondary | ICD-10-CM | POA: Diagnosis present

## 2015-06-25 DIAGNOSIS — Z79899 Other long term (current) drug therapy: Secondary | ICD-10-CM | POA: Diagnosis not present

## 2015-06-25 DIAGNOSIS — S4991XA Unspecified injury of right shoulder and upper arm, initial encounter: Secondary | ICD-10-CM | POA: Insufficient documentation

## 2015-06-25 DIAGNOSIS — S0990XA Unspecified injury of head, initial encounter: Secondary | ICD-10-CM

## 2015-06-25 DIAGNOSIS — S0081XA Abrasion of other part of head, initial encounter: Secondary | ICD-10-CM

## 2015-06-25 DIAGNOSIS — W19XXXA Unspecified fall, initial encounter: Secondary | ICD-10-CM

## 2015-06-25 MED ORDER — BACITRACIN ZINC 500 UNIT/GM EX OINT
TOPICAL_OINTMENT | CUTANEOUS | Status: AC
  Filled 2015-06-25: qty 0.9

## 2015-06-25 NOTE — ED Notes (Signed)
States fell at home, states caught feet, denies SOB, chest pain or dizzyness prior to fall. States was on ground 45 minutes prior to son finding her. States R sided pain, shoulder and  hip, abrasion R face. States landed on R side.

## 2015-06-25 NOTE — ED Provider Notes (Signed)
[facility] Emergency Department Provider Note  ____________________________________________  Time seen: On arrival  I have reviewed the triage vital signs and the nursing notes.   HISTORY  Chief Complaint Fall  Patient does have a history of early dementia  HPI Rachel Reynolds is a 82 y.o. female who presents after a mechanical fall at home. She reports her feet got caught in her walker and she fell forward. He complains primarily of mild right shoulder pain although she points out she can move it easily as well as an abrasion to her right forehead and she notes her neck is a little sore. She denies abdominal pain. No chest pain or palpitations. No shortness of breath. She denies back pain.     Past Medical History  Diagnosis Date  . Hypertension   . Vaginal delivery     4  . History of MRI of brain and brain stem Nov 2012    no evidence of macroangiopathy  . GERD (gastroesophageal reflux disease)   . Glaucoma   . Chronic kidney disease     chronic kidney disease, stage II  . Benign esophageal stricture Oct 2013    dilated by Dr Candace Cruise   . Nonmelanoma skin cancer 11/06    precancerous actinic keratosis s/p liquid nitrogen, Dr. Nehemiah Massed  . breast cancer     right breast cancer 2013    Patient Active Problem List   Diagnosis Date Noted  . Chronic nausea 10/06/2014  . Medicare annual wellness visit, subsequent 04/29/2014  . Abdominal pain, unspecified site 03/12/2014  . Dysuria 03/01/2014  . Hip pain, right 11/01/2013  . Rotator cuff arthropathy 07/13/2013  . Cervical radiculitis 07/13/2013  . Left shoulder pain 07/11/2013  . Pneumonia involving right lung 06/27/2013  . Hematuria, gross 04/12/2013  . Excessive sleepiness 12/10/2012  . Chronic vulvitis 11/16/2012  . Edema 08/08/2012  . Coronary artery disease 04/23/2012  . Benign esophageal stricture   . Invasive lobular carcinoma of breast, stage 1 ([place]) 02/14/2012  . Kidney disease,  chronic, stage III (GFR 30-59 ml/min) 02/14/2012  . Cognitive complaints with normal neuropsychological exam 02/04/2012  . Diarrhea 01/23/2012  . Dysesthesia 08/19/2011  . History of MRI of brain and brain stem   . Hypertension 08/10/2011  . Anemia 02/03/2011    Past Surgical History  Procedure Laterality Date  . Colon surgery    . Colon resection  2008    proximal due to precancerous polyp  . Abdominal hysterectomy  1962    due to hemorrhagic  . Appendectomy  1948  . Bladder suspension    . Breast surgery  nov 2013    core biopsy Dr Burt Knack  . Breast biopsy Right     positive 04/21/2012  . Breast excisional biopsy Right     positive 05/06/2012    Current Outpatient Rx  Name  Route  Sig  Dispense  Refill  . acetaminophen (TYLENOL) 325 MG tablet   Oral   Take 650 mg by mouth every 6 (six) hours as needed.         Marland Kitchen aspirin 81 MG tablet   Oral   Take 81 mg by mouth daily.           . calcium carbonate (OS-CAL) 600 MG TABS   Oral   Take 600 mg by mouth 2 (two) times daily with a meal.           . diphenoxylate-atropine (LOMOTIL) 2.5-0.025 MG per tablet  TAKE 1 TABLET BY MOUTH 4 TIMES DAILY AS NEEDED FOR DIARRHEA OR LOOSE STOOLS.   90 tablet   3   . DORZOLAMIDE HCL-TIMOLOL MAL OP   Ophthalmic   Apply 1 drop to eye 2 (two) times daily. 1 drop into left eye twice daily         . enalapril (VASOTEC) 5 MG tablet      TAKE ONE TABLET BY MOUTH EVERY DAY   90 tablet   1   . latanoprost (XALATAN) 0.005 % ophthalmic solution   Left Eye   Place 1 drop into the left eye at bedtime.         Marland Kitchen omeprazole (PRILOSEC) 20 MG capsule   Oral   Take 20 mg by mouth daily.           . potassium chloride (K-DUR) 10 MEQ tablet   Oral   Take 10 mEq by mouth daily.         . promethazine (PHENERGAN) 12.5 MG tablet   Oral   Take 1 tablet (12.5 mg total) by mouth every 6 (six) hours as needed for nausea or vomiting.   30 tablet   1   . triamcinolone cream  (KENALOG) 0.1 %   Topical   Apply 1 application topically 2 (two) times daily. To urethra   30 g   0   . triamterene-hydrochlorothiazide (MAXZIDE-25) 37.5-25 MG per tablet      TAKE ONE TABLET BY MOUTH EVERY DAY   90 tablet   1   . vitamin B-12 (CYANOCOBALAMIN) 100 MCG tablet   Oral   Take 50 mcg by mouth daily.           Allergies [employer]; Lipitor; Meloxicam; Morphine and related; and Sulfa drugs cross reactors  Family History  Problem Relation Age of Onset  . Coronary artery disease Mother   . Cancer Sister     colon  . Breast cancer Sister     55's  . Coronary artery disease Brother     Social History Social History  Substance Use Topics  . Smoking status: Never Smoker   . Smokeless tobacco: Never Used  . Alcohol Use: No    Review of Systems  Constitutional: No recent illness Eyes: Negative for visual changes. ENT: Positive for neck pain Cardiovascular: Negative for chest pain. Respiratory: Negative for shortness of breath. Gastrointestinal: Negative for abdominal pain, Genitourinary: Negative for dysuria. Negative For foul-smelling urine Musculoskeletal: Negative for back pain. Skin: Negative for rash. Positive for abrasion Neurological: Negative for headaches or focal weakness     ____________________________________________   PHYSICAL EXAM:  VITAL SIGNS: ED Triage Vitals  Enc Vitals Group     BP 06/25/15 1126 135/60 mmHg     Pulse Rate 06/25/15 1126 70     Resp 06/25/15 1126 18     Temp 06/25/15 1126 98.1 F (36.7 C)     Temp src --      SpO2 06/25/15 1126 100 %     Weight 06/25/15 1128 110 lb (49.896 kg)     Height 06/25/15 1128 5' (1.524 m)     Head Cir --      Peak Flow --      Pain Score 06/25/15 1128 8     Pain Loc --      Pain Edu? --      Excl. in [place]? --      Constitutional: Alert and oriented. Well appearing and in no distress. Eyes: Conjunctivae  are normal.  ENT   Head: Normocephalic.   Mouth/Throat: Mucous  membranes are moist. Cardiovascular: Normal rate, regular rhythm. Normal and symmetric distal pulses are present in all extremities.  Respiratory: Normal respiratory effort without tachypnea nor retractions. Breath sounds are clear and equal bilaterally.  Gastrointestinal: Soft and non-tender in all quadrants. No distention. There is no CVA tenderness. Genitourinary: deferred Musculoskeletal: Nontender with normal range of motion in all extremities. No lower extremity tenderness nor edema. No vertebral tenderness to palpation. No pain in the lower extremities with axial load. No pelvic tenderness to palpation Neurologic:  Normal speech and language. No gross focal neurologic deficits are appreciated. Skin:  Skin is warm, dry. Abrasion to the right upper cheek Psychiatric: Mood and affect are normal. Patient exhibits appropriate insight and judgment.  ____________________________________________    LABS (pertinent positives/negatives)  Labs Reviewed - No data to display  ____________________________________________   EKG  ED ECG REPORT I, Lavonia Drafts, the attending physician, personally viewed and interpreted this ECG.  Date: 06/25/2015 EKG Time: 11:22 AM Rate: 69 Rhythm: normal sinus rhythm QRS Axis: normal Intervals: normal ST/T Wave abnormalities: normal Conduction Disutrbances: none Narrative Interpretation: unremarkable   ____________________________________________    RADIOLOGY I have personally reviewed any xrays that were ordered on this patient: CT head and cervical spine no acute distress  ____________________________________________   PROCEDURES  Procedure(s) performed: none  Critical Care performed: none  ____________________________________________   INITIAL IMPRESSION / ASSESSMENT AND PLAN / ED COURSE  Pertinent labs & imaging results that were available during my care of the patient were reviewed by me and considered in my medical decision  making (see chart for details).  Imaging is unremarkable. Exam is reassuring. Patient is ambulating with a nurse's assistant at her baseline. She is appropriate for discharge at this time  ____________________________________________   FINAL CLINICAL IMPRESSION(S) / ED DIAGNOSES  Final diagnoses:  Abrasion of face, initial encounter  Fall, initial encounter  Head injury, initial encounter     Lavonia Drafts, MD 06/25/15 1310

## 2015-06-25 NOTE — Discharge Instructions (Signed)
Abrasion °An abrasion is a cut or scrape on the outer surface of your skin. An abrasion does not extend through all of the layers of your skin. It is important to care for your abrasion properly to prevent infection. °CAUSES °Most abrasions are caused by falling on or gliding across the ground or another surface. When your skin rubs on something, the outer and inner layer of skin rubs off.  °SYMPTOMS °A cut or scrape is the main symptom of this condition. The scrape may be bleeding, or it may appear red or pink. If there was an associated fall, there may be an underlying bruise. °DIAGNOSIS °An abrasion is diagnosed with a physical exam. °TREATMENT °Treatment for this condition depends on how large and deep the abrasion is. Usually, your abrasion will be cleaned with water and mild soap. This removes any dirt or debris that may be stuck. An antibiotic ointment may be applied to the abrasion to help prevent infection. A bandage (dressing) may be placed on the abrasion to keep it clean. °You may also need a tetanus shot. °HOME CARE INSTRUCTIONS °Medicines °· Take or apply medicines only as directed by your health care provider. °· If you were prescribed an antibiotic ointment, finish all of it even if you start to feel better. °Wound Care °· Clean the wound with mild soap and water 2-3 times per day or as directed by your health care provider. Pat your wound dry with a clean towel. Do not rub it. °· There are many different ways to close and cover a wound. Follow instructions from your health care provider about: °· Wound care. °· Dressing changes and removal. °· Check your wound every day for signs of infection. Watch for: °· Redness, swelling, or pain. °· Fluid, blood, or pus. °General Instructions °· Keep the dressing dry as directed by your health care provider. Do not take baths, swim, use a hot tub, or do anything that would put your wound underwater until your health care provider approves. °· If there is  swelling, raise (elevate) the injured area above the level of your heart while you are sitting or lying down. °· Keep all follow-up visits as directed by your health care provider. This is important. °SEEK MEDICAL CARE IF: °· You received a tetanus shot and you have swelling, severe pain, redness, or bleeding at the injection site. °· Your pain is not controlled with medicine. °· You have increased redness, swelling, or pain at the site of your wound. °SEEK IMMEDIATE MEDICAL CARE IF: °· You have a red streak going away from your wound. °· You have a fever. °· You have fluid, blood, or pus coming from your wound. °· You notice a bad smell coming from your wound or your dressing. °  °This information is not intended to replace advice given to you by your health care provider. Make sure you discuss any questions you have with your health care provider. °  °Document Released: 03/12/2005 Document Revised: 02/21/2015 Document Reviewed: 05/31/2014 °Elsevier Interactive Patient Education ©2016 [employer] ° °Head Injury, Adult °You have a head injury. Headaches and throwing up (vomiting) are common after a head injury. It should be easy to wake up from sleeping. Sometimes you must stay in the hospital. Most problems happen within the first 24 hours. Side effects may occur up to 7-10 days after the injury.  °WHAT ARE THE TYPES OF HEAD INJURIES? °Head injuries can be as minor as a bump. Some head injuries can be more   severe. More severe head injuries include: °· A jarring injury to the brain (concussion). °· A bruise of the brain (contusion). This mean there is bleeding in the brain that can cause swelling. °· A cracked skull (skull fracture). °· Bleeding in the brain that collects, clots, and forms a bump (hematoma). °WHEN SHOULD I GET HELP RIGHT AWAY?  °· You are confused or sleepy. °· You cannot be woken up. °· You feel sick to your stomach (nauseous) or keep throwing up (vomiting). °· Your dizziness or unsteadiness is  getting worse. °· You have very bad, lasting headaches that are not helped by medicine. Take medicines only as told by your doctor. °· You cannot use your arms or legs like normal. °· You cannot walk. °· You notice changes in the black spots in the center of the colored part of your eye (pupil). °· You have clear or bloody fluid coming from your nose or ears. °· You have trouble seeing. °During the next 24 hours after the injury, you must stay with someone who can watch you. This person should get help right away (call 911 in the [place] if you start to shake and are not able to control it (have seizures), you pass out, or you are unable to wake up. °HOW CAN I PREVENT A HEAD INJURY IN THE FUTURE? °· Wear seat belts. °· Wear a helmet while bike riding and playing sports like football. °· Stay away from dangerous activities around the house. °WHEN CAN I RETURN TO NORMAL ACTIVITIES AND ATHLETICS? °See your doctor before doing these activities. You should not do normal activities or play contact sports until 1 week after the following symptoms have stopped: °· Headache that does not go away. °· Dizziness. °· Poor attention. °· Confusion. °· Memory problems. °· Sickness to your stomach or throwing up. °· Tiredness. °· Fussiness. °· Bothered by bright lights or loud noises. °· Anxiousness or depression. °· Restless sleep. °MAKE SURE YOU:  °· Understand these instructions. °· Will watch your condition. °· Will get help right away if you are not doing well or get worse. °  °This information is not intended to replace advice given to you by your health care provider. Make sure you discuss any questions you have with your health care provider. °  °Document Released: 05/15/2008 Document Revised: 06/23/2014 Document Reviewed: 02/07/2013 °Elsevier Interactive Patient Education ©2016 [employer] ° °

## 2015-08-29 ENCOUNTER — Ambulatory Visit: Payer: PPO

## 2015-08-30 ENCOUNTER — Ambulatory Visit
Admission: RE | Admit: 2015-08-30 | Discharge: 2015-08-30 | Disposition: A | Discharge status: Home / Self Care | Payer: PPO | Source: Ambulatory Visit | Attending: Specialist | Admitting: Specialist

## 2015-08-30 DIAGNOSIS — R918 Other nonspecific abnormal finding of lung field: Secondary | ICD-10-CM | POA: Insufficient documentation

## 2015-08-30 DIAGNOSIS — I2510 Atherosclerotic heart disease of native coronary artery without angina pectoris: Secondary | ICD-10-CM | POA: Insufficient documentation

## 2015-09-05 ENCOUNTER — Other Ambulatory Visit: Payer: Self-pay | Admitting: Specialist

## 2015-09-05 DIAGNOSIS — R918 Other nonspecific abnormal finding of lung field: Secondary | ICD-10-CM

## 2015-09-06 ENCOUNTER — Other Ambulatory Visit: Payer: PPO

## 2016-01-07 ENCOUNTER — Ambulatory Visit
Admission: RE | Admit: 2016-01-07 | Discharge: 2016-01-07 | Disposition: A | Discharge status: Home / Self Care | Payer: PPO | Source: Ambulatory Visit | Attending: Specialist | Admitting: Specialist

## 2016-01-07 DIAGNOSIS — R918 Other nonspecific abnormal finding of lung field: Secondary | ICD-10-CM | POA: Insufficient documentation

## 2016-01-07 DIAGNOSIS — R590 Localized enlarged lymph nodes: Secondary | ICD-10-CM | POA: Diagnosis not present

## 2016-01-07 DIAGNOSIS — I700 Atherosclerosis of aorta: Secondary | ICD-10-CM | POA: Insufficient documentation

## 2016-01-07 DIAGNOSIS — I2510 Atherosclerotic heart disease of native coronary artery without angina pectoris: Secondary | ICD-10-CM | POA: Diagnosis not present

## 2016-01-28 ENCOUNTER — Emergency Department
Admission: EM | Admit: 2016-01-28 | Discharge: 2016-01-29 | Disposition: A | Discharge status: Home / Self Care | Payer: PPO | Attending: Student in an Organized Health Care Education/Training Program | Admitting: Student in an Organized Health Care Education/Training Program

## 2016-01-28 ENCOUNTER — Emergency Department: Payer: PPO

## 2016-01-28 ENCOUNTER — Encounter: Payer: Self-pay | Admitting: *Deleted

## 2016-01-28 DIAGNOSIS — R109 Unspecified abdominal pain: Secondary | ICD-10-CM

## 2016-01-28 DIAGNOSIS — N183 Chronic kidney disease, stage 3 (moderate): Secondary | ICD-10-CM | POA: Diagnosis not present

## 2016-01-28 DIAGNOSIS — I129 Hypertensive chronic kidney disease with stage 1 through stage 4 chronic kidney disease, or unspecified chronic kidney disease: Secondary | ICD-10-CM | POA: Diagnosis not present

## 2016-01-28 DIAGNOSIS — Z79899 Other long term (current) drug therapy: Secondary | ICD-10-CM | POA: Diagnosis not present

## 2016-01-28 DIAGNOSIS — N390 Urinary tract infection, site not specified: Secondary | ICD-10-CM | POA: Insufficient documentation

## 2016-01-28 DIAGNOSIS — R1084 Generalized abdominal pain: Secondary | ICD-10-CM | POA: Diagnosis present

## 2016-01-28 DIAGNOSIS — K529 Noninfective gastroenteritis and colitis, unspecified: Secondary | ICD-10-CM | POA: Diagnosis not present

## 2016-01-28 DIAGNOSIS — Z853 Personal history of malignant neoplasm of breast: Secondary | ICD-10-CM | POA: Diagnosis not present

## 2016-01-28 DIAGNOSIS — Z85820 Personal history of malignant melanoma of skin: Secondary | ICD-10-CM | POA: Insufficient documentation

## 2016-01-28 DIAGNOSIS — Z7982 Long term (current) use of aspirin: Secondary | ICD-10-CM | POA: Insufficient documentation

## 2016-01-28 DIAGNOSIS — I2510 Atherosclerotic heart disease of native coronary artery without angina pectoris: Secondary | ICD-10-CM | POA: Insufficient documentation

## 2016-01-28 LAB — CBC
HEMATOCRIT: 29 % — AB (ref 35.0–47.0)
Hemoglobin: 9.8 g/dL — ABNORMAL LOW (ref 12.0–16.0)
MCH: 30.6 pg (ref 26.0–34.0)
MCHC: 33.9 g/dL (ref 32.0–36.0)
MCV: 90.4 fL (ref 80.0–100.0)
Platelets: 175 10*3/uL (ref 150–440)
RBC: 3.21 MIL/uL — AB (ref 3.80–5.20)
RDW: 16.4 % — ABNORMAL HIGH (ref 11.5–14.5)
WBC: 8.2 10*3/uL (ref 3.6–11.0)

## 2016-01-28 LAB — COMPREHENSIVE METABOLIC PANEL
ALBUMIN: 3.7 g/dL (ref 3.5–5.0)
ALK PHOS: 80 U/L (ref 38–126)
ALT: 15 U/L (ref 14–54)
AST: 22 U/L (ref 15–41)
Anion gap: 10 (ref 5–15)
BUN: 21 mg/dL — AB (ref 6–20)
CALCIUM: 9 mg/dL (ref 8.9–10.3)
CO2: 26 mmol/L (ref 22–32)
CREATININE: 0.95 mg/dL (ref 0.44–1.00)
Chloride: 102 mmol/L (ref 101–111)
GFR calc non Af Amer: 52 mL/min — ABNORMAL LOW (ref >60)
GLUCOSE: 123 mg/dL — AB (ref 65–99)
Potassium: 3.3 mmol/L — ABNORMAL LOW (ref 3.5–5.1)
SODIUM: 138 mmol/L (ref 135–145)
Total Bilirubin: 0.8 mg/dL (ref 0.3–1.2)
Total Protein: 7.4 g/dL (ref 6.5–8.1)

## 2016-01-28 LAB — URINALYSIS COMPLETE WITH MICROSCOPIC (ARMC ONLY)
Bilirubin Urine: NEGATIVE
GLUCOSE, UA: NEGATIVE mg/dL
Hgb urine dipstick: NEGATIVE
Ketones, ur: NEGATIVE mg/dL
Leukocytes, UA: NEGATIVE
Nitrite: POSITIVE — AB
Specific Gravity, Urine: 1.014 (ref 1.005–1.030)
pH: 7 (ref 5.0–8.0)

## 2016-01-28 LAB — LIPASE, BLOOD: Lipase: 40 U/L (ref 11–51)

## 2016-01-28 MED ORDER — DIATRIZOATE MEGLUMINE & SODIUM 66-10 % PO SOLN
15.0000 mL | ORAL | Status: AC
  Administered 2016-01-28 (×2): 15 mL via ORAL

## 2016-01-28 MED ORDER — DEXTROSE 5 % IV SOLN
1.0000 g | Freq: Once | INTRAVENOUS | Status: AC
  Administered 2016-01-28: 1 g via INTRAVENOUS
  Filled 2016-01-28: qty 10

## 2016-01-28 MED ORDER — IOPAMIDOL (ISOVUE-300) INJECTION 61%
75.0000 mL | Freq: Once | INTRAVENOUS | Status: AC | PRN
  Administered 2016-01-28: 75 mL via INTRAVENOUS

## 2016-01-28 NOTE — ED Notes (Signed)
IV insertion attempted x2 without success, another RN will attempt.  

## 2016-01-28 NOTE — ED Provider Notes (Signed)
[facility] Emergency Department Provider Note    First MD Initiated Contact with Patient 01/28/16 2144     (approximate)  I have reviewed the triage vital signs and the nursing notes.   HISTORY  Chief Complaint Abdominal Pain (R & L LQ)    HPI Rachel Reynolds is a 82 y.o. female presents with chief complaint of bilateral lower abdominal pain and pressure that started 24 hours prior. Patient does have a history of dementia and is a poor historian. Patient denies any dysuria or hematuria. No flank pain. No nausea or vomiting. No recent fevers. Denies any shortness of breath or chest pain. She is status post colon cancer with subsequent hemicolectomy. States that she has episodes of constipation followed by severe diarrhea. Denies any blood in her stool. States she has not moved her bowels in 3 days.   Past Medical History:  Diagnosis Date  . Benign esophageal stricture Oct 2013   dilated by Dr Candace Cruise   . breast cancer    right breast cancer 2013  . Chronic kidney disease    chronic kidney disease, stage II  . GERD (gastroesophageal reflux disease)   . Glaucoma   . History of MRI of brain and brain stem Nov 2012   no evidence of macroangiopathy  . Hypertension   . Nonmelanoma skin cancer 11/06   precancerous actinic keratosis s/p liquid nitrogen, Dr. Nehemiah Massed  . Vaginal delivery    4    Patient Active Problem List   Diagnosis Date Noted  . Chronic nausea 10/06/2014  . Medicare annual wellness visit, subsequent 04/29/2014  . Abdominal pain, unspecified site 03/12/2014  . Dysuria 03/01/2014  . Hip pain, right 11/01/2013  . Rotator cuff arthropathy 07/13/2013  . Cervical radiculitis 07/13/2013  . Left shoulder pain 07/11/2013  . Pneumonia involving right lung 06/27/2013  . Hematuria, gross 04/12/2013  . Excessive sleepiness 12/10/2012  . Chronic vulvitis 11/16/2012  . Edema 08/08/2012  . Coronary artery disease 04/23/2012  . Benign  esophageal stricture   . Invasive lobular carcinoma of breast, stage 1 ([place]) 02/14/2012  . Kidney disease, chronic, stage III (GFR 30-59 ml/min) 02/14/2012  . Cognitive complaints with normal neuropsychological exam 02/04/2012  . Diarrhea 01/23/2012  . Dysesthesia 08/19/2011  . History of MRI of brain and brain stem   . Hypertension 08/10/2011  . Anemia 02/03/2011    Past Surgical History:  Procedure Laterality Date  . ABDOMINAL HYSTERECTOMY  1962   due to hemorrhagic  . APPENDECTOMY  1948  . BLADDER SUSPENSION    . BREAST BIOPSY Right    positive 04/21/2012  . BREAST EXCISIONAL BIOPSY Right    positive 05/06/2012  . BREAST SURGERY  nov 2013   core biopsy Dr Burt Knack  . COLON RESECTION  2008   proximal due to precancerous polyp  . COLON SURGERY      Prior to Admission medications   Medication Sig Start Date End Date Taking? Authorizing Provider  acetaminophen (TYLENOL) 325 MG tablet Take 650 mg by mouth every 6 (six) hours as needed.    Historical Provider, MD  aspirin 81 MG tablet Take 81 mg by mouth daily.      Historical Provider, MD  calcium carbonate (OS-CAL) 600 MG TABS Take 600 mg by mouth 2 (two) times daily with a meal.      Historical Provider, MD  diphenoxylate-atropine (LOMOTIL) 2.5-0.025 MG per tablet TAKE 1 TABLET BY MOUTH 4 TIMES DAILY AS NEEDED FOR DIARRHEA OR  LOOSE STOOLS. 01/09/14   Crecencio Mc, MD  DORZOLAMIDE HCL-TIMOLOL MAL OP Apply 1 drop to eye 2 (two) times daily. 1 drop into left eye twice daily    Historical Provider, MD  enalapril (VASOTEC) 5 MG tablet TAKE ONE TABLET BY MOUTH EVERY DAY 10/02/14   Crecencio Mc, MD  latanoprost (XALATAN) 0.005 % ophthalmic solution Place 1 drop into the left eye at bedtime.    Historical Provider, MD  omeprazole (PRILOSEC) 20 MG capsule Take 20 mg by mouth daily.      Historical Provider, MD  potassium chloride (K-DUR) 10 MEQ tablet Take 10 mEq by mouth daily.    Historical Provider, MD  promethazine (PHENERGAN) 12.5  MG tablet Take 1 tablet (12.5 mg total) by mouth every 6 (six) hours as needed for nausea or vomiting. 10/06/14   Minna Merritts, MD  triamcinolone cream (KENALOG) 0.1 % Apply 1 application topically 2 (two) times daily. To urethra 03/10/14   Crecencio Mc, MD  triamterene-hydrochlorothiazide (MAXZIDE-25) 37.5-25 MG per tablet TAKE ONE TABLET BY MOUTH EVERY DAY 07/03/14   Crecencio Mc, MD  vitamin B-12 (CYANOCOBALAMIN) 100 MCG tablet Take 50 mcg by mouth daily.    Historical Provider, MD    Allergies [employer] [tretinoin]; Lipitor [atorvastatin calcium]; Meloxicam; Morphine and related; and Sulfa drugs cross reactors  Family History  Problem Relation Age of Onset  . Coronary artery disease Mother   . Cancer Sister     colon  . Breast cancer Sister     37's  . Coronary artery disease Brother     Social History Social History  Substance Use Topics  . Smoking status: Never Smoker  . Smokeless tobacco: Never Used  . Alcohol use No    Review of Systems Patient denies headaches, rhinorrhea, blurry vision, numbness, shortness of breath, chest pain, edema, cough, abdominal pain, nausea, vomiting, diarrhea, dysuria, fevers, rashes or hallucinations unless otherwise stated above in HPI. ____________________________________________   PHYSICAL EXAM:  VITAL SIGNS: Vitals:   01/28/16 2012  BP: (!) 186/57  Pulse: 67  Resp: (!) 36  Temp: 98.9 F (37.2 C)    Constitutional: Alert and oriented. Well appearing and in no acute distress. Eyes: Conjunctivae are normal. PERRL. EOMI. Head: Atraumatic. Nose: No congestion/rhinnorhea. Mouth/Throat: Mucous membranes are moist.  Oropharynx non-erythematous. Neck: No stridor. Painless ROM. No cervical spine tenderness to palpation Hematological/Lymphatic/Immunilogical: No cervical lymphadenopathy. Cardiovascular: Normal rate, regular rhythm. Grossly normal heart sounds.  Good peripheral circulation. Respiratory: Normal respiratory effort.  No  retractions. Lungs CTAB. Gastrointestinal: Soft, mild ttp in RLQ and LLQ. No distention. No abdominal bruits. No CVA tenderness. Genitourinary:  Musculoskeletal: No lower extremity tenderness nor edema.  No joint effusions. Neurologic:  Normal speech and language. No gross focal neurologic deficits are appreciated. No facial droop Skin:  Skin is warm, dry and intact. No rash noted. Psychiatric: Mood and affect are normal. Speech and behavior are normal.  ____________________________________________   LABS (all labs ordered are listed, but only abnormal results are displayed)  Results for orders placed or performed during the hospital encounter of 01/28/16 (from the past 24 hour(s))  Lipase, blood     Status: None   Collection Time: 01/28/16  8:16 PM  Result Value Ref Range   Lipase 40 11 - 51 U/L  Comprehensive metabolic panel     Status: Abnormal   Collection Time: 01/28/16  8:16 PM  Result Value Ref Range   Sodium 138 135 - 145 mmol/L  Potassium 3.3 (L) 3.5 - 5.1 mmol/L   Chloride 102 101 - 111 mmol/L   CO2 26 22 - 32 mmol/L   Glucose, Bld 123 (H) 65 - 99 mg/dL   BUN 21 (H) 6 - 20 mg/dL   Creatinine, Ser 0.95 0.44 - 1.00 mg/dL   Calcium 9.0 8.9 - 10.3 mg/dL   Total Protein 7.4 6.5 - 8.1 g/dL   Albumin 3.7 3.5 - 5.0 g/dL   AST 22 15 - 41 U/L   ALT 15 14 - 54 U/L   Alkaline Phosphatase 80 38 - 126 U/L   Total Bilirubin 0.8 0.3 - 1.2 mg/dL   GFR calc non Af Amer 52 (L) >60 mL/min   GFR calc Af Amer >60 >60 mL/min   Anion gap 10 5 - 15  Urinalysis complete, with microscopic     Status: Abnormal   Collection Time: 01/28/16  8:17 PM  Result Value Ref Range   Color, Urine YELLOW (A) YELLOW   APPearance HAZY (A) CLEAR   Glucose, UA NEGATIVE NEGATIVE mg/dL   Bilirubin Urine NEGATIVE NEGATIVE   Ketones, ur NEGATIVE NEGATIVE mg/dL   Specific Gravity, Urine 1.014 1.005 - 1.030   Hgb urine dipstick NEGATIVE NEGATIVE   pH 7.0 5.0 - 8.0   Protein, ur >500 (A) NEGATIVE mg/dL    Nitrite POSITIVE (A) NEGATIVE   Leukocytes, UA NEGATIVE NEGATIVE   RBC / HPF 0-5 0 - 5 RBC/hpf   WBC, UA 6-30 0 - 5 WBC/hpf   Bacteria, UA MANY (A) NONE SEEN   Squamous Epithelial / LPF 0-5 (A) NONE SEEN   Mucous PRESENT   CBC     Status: Abnormal   Collection Time: 01/28/16  8:39 PM  Result Value Ref Range   WBC 8.2 3.6 - 11.0 K/uL   RBC 3.21 (L) 3.80 - 5.20 MIL/uL   Hemoglobin 9.8 (L) 12.0 - 16.0 g/dL   HCT 29.0 (L) 35.0 - 47.0 %   MCV 90.4 80.0 - 100.0 fL   MCH 30.6 26.0 - 34.0 pg   MCHC 33.9 32.0 - 36.0 g/dL   RDW 16.4 (H) 11.5 - 14.5 %   Platelets 175 150 - 440 K/uL   ____________________________________________ ____________________________________________  RADIOLOGY  Ct abd/pelv with evidence of colitis without abscess or perforation. ____________________________________________   PROCEDURES  Procedure(s) performed: none    Critical Care performed: no ____________________________________________   INITIAL IMPRESSION / ASSESSMENT AND PLAN / ED COURSE  Pertinent labs & imaging results that were available during my care of the patient were reviewed by me and considered in my medical decision making (see chart for details).  DDX: UTI, constipation, colitis, mass hydration  Rachel Reynolds is a 82 y.o. who presents to the ED with complaint of right and left lower quadrant abdominal pain for the last 24 hours. Patient arrives afebrile hemodynamically stable does have mild tenderness on exam. UA does show evidence of UTI the patient denies dysuria at this time. Laboratory evaluation is otherwise unremarkable.  Based on her tenderness and history of colon cancer will order CT scan of the abdomen and pelvis to evaluate for any obstructive processes as well as concern for mass or acute infectious process. IV Rocephin to treat uncomplicated acute cystitis  Clinical Course   ----------------------------------------- 12:28 AM on  01/29/2016 -----------------------------------------  Patient is tolerating food by mouth for now and Biaxin infused. CT abdomen shows evidence of small uncomplicated colitis without abscess or perforation. I will add Flagyl to her  antibiotic regimen. Was encouraged patient to increase her yogurt intake. Discussed need to follow up with Dr. Candiss Norse as an outpatient. Discussed signs and symptoms for which she should return emergently to the hospital for further evaluation and management.  Have discussed with the patient and available family all diagnostics and treatments performed thus far and all questions were answered to the best of my ability. The patient demonstrates understanding and agreement with plan.  ____________________________________________   FINAL CLINICAL IMPRESSION(S) / ED DIAGNOSES  Final diagnoses:  UTI (lower urinary tract infection)  Generalized abdominal pain  Colitis      NEW MEDICATIONS STARTED DURING THIS VISIT:  New Prescriptions   No medications on file     Note:  This document was prepared using Dragon voice recognition software and may include unintentional dictation errors.    Merlyn Lot, MD 01/29/16 [phone]

## 2016-01-28 NOTE — ED Triage Notes (Signed)
Pt c/o slight bilateral abdominal pain starting yesterday evening. Pt has hx of colon CA and has had 8 inches of her colon removed. Pt has hx of sudden onset of diarrhea but has not had either diarrhea or constipation since pain started. Denies n/v/d and dysuria at this time. Pt has hx of dementia and is somewhat poor historian. Pt has a caregiver that stays with her daily. Pt in no acute distress at thist ime, denies mobility changes, normally uses a walker to ambulate.

## 2016-01-29 MED ORDER — POLYETHYLENE GLYCOL 3350 17 GM/SCOOP PO POWD: 1.0000 | Freq: Once | ORAL | 0 refills | Status: AC

## 2016-01-29 MED ORDER — METRONIDAZOLE 500 MG PO TABS: 500.0000 mg | ORAL_TABLET | Freq: Three times a day (TID) | ORAL | 0 refills | Status: AC

## 2016-01-29 MED ORDER — ACETAMINOPHEN 500 MG PO TABS
1000.0000 mg | ORAL_TABLET | Freq: Once | ORAL | Status: AC
  Administered 2016-01-29: 1000 mg via ORAL
  Filled 2016-01-29: qty 2

## 2016-01-29 MED ORDER — CEPHALEXIN 500 MG PO CAPS: 500.0000 mg | ORAL_CAPSULE | Freq: Three times a day (TID) | ORAL | 0 refills | Status: AC

## 2016-05-20 ENCOUNTER — Emergency Department: Payer: PPO

## 2016-05-20 ENCOUNTER — Encounter: Payer: Self-pay | Admitting: Emergency Medicine

## 2016-05-20 ENCOUNTER — Observation Stay
Admission: EM | Admit: 2016-05-20 | Discharge: 2016-06-16 | Disposition: E | Payer: PPO | Attending: Internal Medicine | Admitting: Internal Medicine

## 2016-05-20 DIAGNOSIS — K219 Gastro-esophageal reflux disease without esophagitis: Secondary | ICD-10-CM | POA: Diagnosis not present

## 2016-05-20 DIAGNOSIS — Z66 Do not resuscitate: Secondary | ICD-10-CM | POA: Insufficient documentation

## 2016-05-20 DIAGNOSIS — Z853 Personal history of malignant neoplasm of breast: Secondary | ICD-10-CM | POA: Diagnosis not present

## 2016-05-20 DIAGNOSIS — Z79899 Other long term (current) drug therapy: Secondary | ICD-10-CM | POA: Insufficient documentation

## 2016-05-20 DIAGNOSIS — Z515 Encounter for palliative care: Secondary | ICD-10-CM | POA: Diagnosis not present

## 2016-05-20 DIAGNOSIS — Z7982 Long term (current) use of aspirin: Secondary | ICD-10-CM | POA: Insufficient documentation

## 2016-05-20 DIAGNOSIS — I129 Hypertensive chronic kidney disease with stage 1 through stage 4 chronic kidney disease, or unspecified chronic kidney disease: Secondary | ICD-10-CM | POA: Diagnosis not present

## 2016-05-20 DIAGNOSIS — N183 Chronic kidney disease, stage 3 (moderate): Secondary | ICD-10-CM | POA: Diagnosis not present

## 2016-05-20 DIAGNOSIS — Z85828 Personal history of other malignant neoplasm of skin: Secondary | ICD-10-CM | POA: Insufficient documentation

## 2016-05-20 DIAGNOSIS — I619 Nontraumatic intracerebral hemorrhage, unspecified: Secondary | ICD-10-CM | POA: Diagnosis present

## 2016-05-20 DIAGNOSIS — F0390 Unspecified dementia without behavioral disturbance: Secondary | ICD-10-CM | POA: Insufficient documentation

## 2016-05-20 DIAGNOSIS — I2510 Atherosclerotic heart disease of native coronary artery without angina pectoris: Secondary | ICD-10-CM | POA: Insufficient documentation

## 2016-05-20 DIAGNOSIS — J9601 Acute respiratory failure with hypoxia: Secondary | ICD-10-CM | POA: Diagnosis not present

## 2016-05-20 DIAGNOSIS — R569 Unspecified convulsions: Secondary | ICD-10-CM

## 2016-05-20 LAB — PROTIME-INR
INR: 1.11
Prothrombin Time: 14.4 seconds (ref 11.4–15.2)

## 2016-05-20 LAB — CBC
HEMATOCRIT: 29.6 % — AB (ref 35.0–47.0)
Hemoglobin: 10 g/dL — ABNORMAL LOW (ref 12.0–16.0)
MCH: 30.3 pg (ref 26.0–34.0)
MCHC: 33.9 g/dL (ref 32.0–36.0)
MCV: 89.3 fL (ref 80.0–100.0)
Platelets: 245 10*3/uL (ref 150–440)
RBC: 3.31 MIL/uL — ABNORMAL LOW (ref 3.80–5.20)
RDW: 14.1 % (ref 11.5–14.5)
WBC: 9.8 10*3/uL (ref 3.6–11.0)

## 2016-05-20 LAB — BLOOD GAS, ARTERIAL
Acid-base deficit: 0 mmol/L (ref 0.0–2.0)
BICARBONATE: 23.3 mmol/L (ref 20.0–28.0)
FIO2: 0.5
LHR: 16 {breaths}/min
O2 Saturation: 99.9 %
PEEP/CPAP: 5 cmH2O
Patient temperature: 37
VT: 450 mL
pCO2 arterial: 32 mmHg (ref 32.0–48.0)
pH, Arterial: 7.47 — ABNORMAL HIGH (ref 7.350–7.450)
pO2, Arterial: 262 mmHg — ABNORMAL HIGH (ref 83.0–108.0)

## 2016-05-20 LAB — COMPREHENSIVE METABOLIC PANEL
ALT: 22 U/L (ref 14–54)
ANION GAP: 11 (ref 5–15)
AST: 40 U/L (ref 15–41)
Albumin: 3.7 g/dL (ref 3.5–5.0)
Alkaline Phosphatase: 109 U/L (ref 38–126)
BILIRUBIN TOTAL: 0.5 mg/dL (ref 0.3–1.2)
BUN: 25 mg/dL — ABNORMAL HIGH (ref 6–20)
CO2: 25 mmol/L (ref 22–32)
Calcium: 9.1 mg/dL (ref 8.9–10.3)
Chloride: 102 mmol/L (ref 101–111)
Creatinine, Ser: 1.26 mg/dL — ABNORMAL HIGH (ref 0.44–1.00)
GFR, EST AFRICAN AMERICAN: 43 mL/min — AB (ref >60)
GFR, EST NON AFRICAN AMERICAN: 37 mL/min — AB (ref >60)
Glucose, Bld: 175 mg/dL — ABNORMAL HIGH (ref 65–99)
POTASSIUM: 3.7 mmol/L (ref 3.5–5.1)
Sodium: 138 mmol/L (ref 135–145)
TOTAL PROTEIN: 7.2 g/dL (ref 6.5–8.1)

## 2016-05-20 LAB — APTT: aPTT: 27 seconds (ref 24–36)

## 2016-05-20 LAB — TROPONIN I: Troponin I: <0.03 ng/mL (ref <0.03)

## 2016-05-20 LAB — TYPE AND SCREEN
ABO/RH(D): B POS
ANTIBODY SCREEN: NEGATIVE

## 2016-05-20 LAB — LACTIC ACID, PLASMA: LACTIC ACID, VENOUS: 1.7 mmol/L (ref 0.5–1.9)

## 2016-05-20 MED ORDER — SUCCINYLCHOLINE CHLORIDE 20 MG/ML IJ SOLN
80.0000 mg | Freq: Once | INTRAMUSCULAR | Status: AC
  Administered 2016-05-20: 80 mg via INTRAVENOUS

## 2016-05-20 MED ORDER — NITROGLYCERIN IN D5W 200-5 MCG/ML-% IV SOLN
0.0000 ug/min | Freq: Once | INTRAVENOUS | Status: AC
  Administered 2016-05-20: 20 ug/min via INTRAVENOUS
  Filled 2016-05-20: qty 250

## 2016-05-20 MED ORDER — PROPOFOL 1000 MG/100ML IV EMUL
INTRAVENOUS | Status: AC
  Administered 2016-05-20: 20 ug/kg/min via INTRAVENOUS
  Filled 2016-05-20: qty 100

## 2016-05-20 MED ORDER — PROPOFOL 1000 MG/100ML IV EMUL: 5.0000 ug/kg/min | INTRAVENOUS | Status: DC

## 2016-05-20 MED ORDER — FENTANYL 2500MCG IN NS 250ML (10MCG/ML) PREMIX INFUSION
25.0000 ug/h | INTRAVENOUS | Status: DC
  Administered 2016-05-20: 25 ug/h via INTRAVENOUS

## 2016-05-20 MED ORDER — SODIUM CHLORIDE 0.9 % IV BOLUS (SEPSIS)
1000.0000 mL | Freq: Once | INTRAVENOUS | Status: AC
  Administered 2016-05-20: 1000 mL via INTRAVENOUS

## 2016-05-20 MED ORDER — FENTANYL 2500MCG IN NS 250ML (10MCG/ML) PREMIX INFUSION
INTRAVENOUS | Status: AC
  Administered 2016-05-20: 25 ug/h via INTRAVENOUS
  Filled 2016-05-20: qty 250

## 2016-05-20 MED ORDER — PROPOFOL 1000 MG/100ML IV EMUL
5.0000 ug/kg/min | Freq: Once | INTRAVENOUS | Status: DC
  Administered 2016-05-20: 20 ug/kg/min via INTRAVENOUS

## 2016-05-20 NOTE — ED Notes (Signed)
Rounded on family, given chairs.

## 2016-05-20 NOTE — ED Triage Notes (Signed)
Pt to ED via EMS from [facility] lives independent with caregiver.  EMS states patient went to restroom where she passed out, with episode diarrhea and still on floor when EMS arrived; patient with 1 pinpoint pupil and 16mm pupil and in a.fib.  EMS gave 0.5 narcan with no change.  Patient arrived with no family.

## 2016-05-20 NOTE — ED Notes (Signed)
Rounded on patient and family.  Family denies needs at this time.

## 2016-05-20 NOTE — H&P (Signed)
Rachel Reynolds    MR#:  [id number]  DATE OF BIRTH:  [birth year]  DATE OF ADMISSION:  05/26/2016  PRIMARY CARE PHYSICIAN: Rachel Axe, MD   REQUESTING/REFERRING PHYSICIAN: Mariea Clonts, MD  CHIEF COMPLAINT:   Chief Complaint  Patient presents with  . Respiratory Distress  . Diarrhea    HISTORY OF PRESENT ILLNESS:  Rachel Reynolds  is a 82 y.o. female who presents with Large intracranial hemorrhage. Patient's family decided that given the extent of the patient's brain bleed that they would opt to pursue comfort measures. Hospitalists were called for admission  PAST MEDICAL HISTORY:   Past Medical History:  Diagnosis Date  . Benign esophageal stricture Oct 2013   dilated by Dr Candace Cruise   . breast cancer    right breast cancer 2013  . Chronic kidney disease    chronic kidney disease, stage II  . GERD (gastroesophageal reflux disease)   . Glaucoma   . History of MRI of brain and brain stem Nov 2012   no evidence of macroangiopathy  . Hypertension   . Nonmelanoma skin cancer 11/06   precancerous actinic keratosis s/p liquid nitrogen, Dr. Nehemiah Massed  . Vaginal delivery    4    PAST SURGICAL HISTORY:   Past Surgical History:  Procedure Laterality Date  . ABDOMINAL HYSTERECTOMY  1962   due to hemorrhagic  . APPENDECTOMY  1948  . BLADDER SUSPENSION    . BREAST BIOPSY Right    positive 04/21/2012  . BREAST EXCISIONAL BIOPSY Right    positive 05/06/2012  . BREAST SURGERY  nov 2013   core biopsy Dr Burt Knack  . COLON RESECTION  2008   proximal due to precancerous polyp  . COLON SURGERY      SOCIAL HISTORY:   Social History  Substance Use Topics  . Smoking status: Never Smoker  . Smokeless tobacco: Never Used  . Alcohol use No    FAMILY HISTORY:   Family History  Problem Relation Age of Onset  . Coronary artery disease Mother   . Cancer Sister     colon  . Breast cancer Sister     21's  .  Coronary artery disease Brother     DRUG ALLERGIES:   Allergies  Allergen Reactions  . [employer] [Tretinoin]   . Lipitor [Atorvastatin Calcium]   . Meloxicam   . Morphine And Related   . Sulfa Drugs Cross Reactors     MEDICATIONS AT HOME:   Prior to Admission medications   Medication Sig Start Date End Date Taking? Authorizing Provider  acetaminophen (TYLENOL) 325 MG tablet Take 650 mg by mouth every 6 (six) hours as needed.   Yes Historical Provider, MD  aspirin 81 MG tablet Take 81 mg by mouth daily.     Yes Historical Provider, MD  calcium carbonate (OS-CAL) 600 MG TABS Take 600 mg by mouth 2 (two) times daily with a meal.     Yes Historical Provider, MD  enalapril (VASOTEC) 10 MG tablet Take 10 mg by mouth daily.   Yes Historical Provider, MD  omeprazole (PRILOSEC) 20 MG capsule Take 20 mg by mouth daily.     Yes Historical Provider, MD  triamterene-hydrochlorothiazide (MAXZIDE-25) 37.5-25 MG per tablet TAKE ONE TABLET BY MOUTH EVERY DAY 07/03/14  Yes Crecencio Mc, MD  diphenoxylate-atropine (LOMOTIL) 2.5-0.025 MG per tablet TAKE 1 TABLET BY MOUTH 4 TIMES DAILY AS NEEDED FOR DIARRHEA OR LOOSE STOOLS. Patient not  taking: Reported on 06/03/2016 01/09/14   Crecencio Mc, MD  promethazine (PHENERGAN) 12.5 MG tablet Take 1 tablet (12.5 mg total) by mouth every 6 (six) hours as needed for nausea or vomiting. Patient not taking: Reported on 05/27/2016 10/06/14   Minna Merritts, MD  triamcinolone cream (KENALOG) 0.1 % Apply 1 application topically 2 (two) times daily. To urethra Patient not taking: Reported on 05/19/2016 03/10/14   Crecencio Mc, MD    REVIEW OF SYSTEMS:  Review of Systems  Unable to perform ROS: Acuity of condition     VITAL SIGNS:   Vitals:   05/22/2016 2115 05/17/2016 2200 05/30/2016 2230 06/07/2016 2300  BP: (!) 215/83 (!) 198/72 (!) 193/69 (!) 232/82  Pulse: 68 69 71 73  Resp: 20 17 16 13   SpO2: 100% 100% 100% 100%  Weight:      Height:       Wt Readings from Last  3 Encounters:  05/26/2016 50 kg (110 lb 3.7 oz)  01/28/16 45.4 kg (100 lb)  06/25/15 49.9 kg (110 lb)    PHYSICAL EXAMINATION:  Physical Exam  Vitals reviewed. Constitutional: She appears well-developed and well-nourished.  HENT:  Head: Normocephalic and atraumatic.  Mouth/Throat: Oropharynx is clear and moist.  Eyes: Conjunctivae are normal. No scleral icterus.  Neck: Normal range of motion. Neck supple. No JVD present. No thyromegaly present.  Cardiovascular: Normal rate, regular rhythm and intact distal pulses.  Exam reveals no gallop and no friction rub.   No murmur heard. Respiratory: She is in respiratory distress (stable agonal breathing). She has no wheezes. She has no rales.  GI: Soft. Bowel sounds are normal. She exhibits no distension. There is no tenderness.  Musculoskeletal: Normal range of motion. She exhibits no edema.  No arthritis, no gout  Lymphadenopathy:    She has no cervical adenopathy.  Neurological:  Unable to assess due to patient's condition  Skin: Skin is warm and dry. No rash noted. No erythema.  Psychiatric:  Unable to assess due to patient's condition    LABORATORY PANEL:   CBC  Recent Labs Lab 05/23/2016 1933  WBC 9.8  HGB 10.0*  HCT 29.6*  PLT 245   ------------------------------------------------------------------------------------------------------------------  Chemistries   Recent Labs Lab 06/11/2016 1933  NA 138  K 3.7  CL 102  CO2 25  GLUCOSE 175*  BUN 25*  CREATININE 1.26*  CALCIUM 9.1  AST 40  ALT 22  ALKPHOS 109  BILITOT 0.5   ------------------------------------------------------------------------------------------------------------------  Cardiac Enzymes  Recent Labs Lab 05/19/2016 1933  TROPONINI <0.03   ------------------------------------------------------------------------------------------------------------------  RADIOLOGY:  Ct Head Wo Contrast  Result Date: 05/24/2016 CLINICAL DATA:  Collapse. EXAM:  CT HEAD WITHOUT CONTRAST TECHNIQUE: Contiguous axial images were obtained from the base of the skull through the vertex without intravenous contrast. COMPARISON:  CT scan of June 25, 2015. FINDINGS: Brain: 7.6 x 6.2 cm intraparenchymal hemorrhage is noted arising from left basal ganglia. Extension of hemorrhage into the left lateral ventricle is noted as well as the third and fourth ventricles. Small amount of hemorrhage is noted in the right posterior horn. Dilatation of right lateral ventricle is noted. This results in 12 mm of left-to-right midline shift. Vascular: Atherosclerosis of carotid siphons is noted. Skull: Normal. Negative for fracture or focal lesion. Sinuses/Orbits: No acute finding. Other: None. IMPRESSION: Large intraparenchymal hemorrhage is seen arising from left basal ganglia, with extension into left lateral ventricle, third and fourth ventricles. This results in significant dilatation of right lateral ventricle as  well as 12 mm of left-to-right midline shift. Critical Value/emergent results were called by telephone at the time of interpretation on 05/26/2016 at 8:46 pm to Dr. Eula Listen , who verbally acknowledged these results. Electronically Signed   By: Marijo Conception, M.D.   On: 06/15/2016 20:47   Dg Chest Portable 1 View  Result Date: 05/17/2016 CLINICAL DATA:  Respiratory failure.  Intubated. EXAM: PORTABLE CHEST 1 VIEW COMPARISON:  CT chest 01/07/2016 and CXR 09/20/2014 FINDINGS: The tip of an endotracheal tube is seen approximately 3.3 cm above the carina. The lungs are hyperinflated without pneumonic consolidation or pneumothorax. No effusion or CHF. Cardiac size is within normal limits. There is aortic atherosclerosis. Soft tissue calcifications adjacent the right humeral head likely represent calcific bursitis. Degenerative subcortical cysts of the left humeral head and right glenoid. IMPRESSION: Endotracheal tube tip approximately 3.3 cm above the carina.  Hyperinflated lungs without pneumothorax or pulmonary consolidation. Soft tissue calcifications adjacent to the right humeral head consistent calcific bursitis. Electronically Signed   By: Ashley Royalty M.D.   On: 05/16/2016 20:08    EKG:   Orders placed or performed during the hospital encounter of 06/02/2016  . ED EKG  . ED EKG    IMPRESSION AND PLAN:  Principal Problem:   ICH (intracerebral hemorrhage) (HCC) - extensive intraparenchymal hemorrhage originating likely from her basal ganglia. Given the very minute possibility for any kind of meaningful recovery patient's family has opted for comfort measures. We will admit her with comfort measures order set, and a palliative care consult for the morning if the patient is lingering.  All the records are reviewed and case discussed with ED provider. Management plans discussed with the patient and/or family.  DVT PROPHYLAXIS: None  GI PROPHYLAXIS: None  ADMISSION STATUS: Observation  CODE STATUS: DNR Code Status History    This patient does not have a recorded code status. Please follow your organizational policy for patients in this situation.      TOTAL TIME TAKING CARE OF THIS PATIENT: 40 minutes.    Ciria Bernardini [place] 05/25/2016, 11:29 PM  [facility]  Office  [phone]  CC: Primary care physician; Rachel Axe, MD

## 2016-05-20 NOTE — ED Notes (Signed)
Charge nurse rounded on patient and family and offered nutrition.

## 2016-05-20 NOTE — ED Provider Notes (Signed)
[facility] Emergency Department Provider Note ____________________________________________  Time seen: Approximately 9:18 PM  I have reviewed the triage vital signs and the nursing notes.   HISTORY  Chief Complaint Respiratory Distress and Diarrhea    HPI Rachel Reynolds is a 82 y.o. female with a history of dementia brought by EMS for unresponsiveness in agonal breathing. The patient's caregiver states that she went into the patient's home and found her unresponsive on the ground and possibly having tonic-clonic activity. Upon EMS arrival, the patient had agonal breathing and was supported with bag valve mask ventilation. Her blood sugar was normal, and she did not have any signs of acute injury.  On arrival to the emergency department, the patient was unresponsive except to painful stimulus. She had occasional posturing or possible subclinical seizure activity in her upper extremities. She had intermittent agonal breathing. Her blood pressure was greater than 200. The decision was made to intubate the patient immediately for airway protection. Intravenous access was obtained, intravenous fluids were started and a nitroglycerin drip was initiated. Her CT scan showed a large intraparenchymal bleed on the left side with significant midline shift.  During the course of her emergency department stay, the patient occasionally had bradycardia into the 40s.  After long discussion with the family, comfort measures were initiated. The patient was extubated, all medications were stopped, and she was admitted to the hospitalist.  Past Medical History:  Diagnosis Date  . Benign esophageal stricture Oct 2013   dilated by Dr Candace Cruise   . breast cancer    right breast cancer 2013  . Chronic kidney disease    chronic kidney disease, stage II  . GERD (gastroesophageal reflux disease)   . Glaucoma   . History of MRI of brain and brain stem Nov 2012   no evidence of  macroangiopathy  . Hypertension   . Nonmelanoma skin cancer 11/06   precancerous actinic keratosis s/p liquid nitrogen, Dr. Nehemiah Massed  . Vaginal delivery    4    Patient Active Problem List   Diagnosis Date Noted  . ICH (intracerebral hemorrhage) ([place]) 05/29/2016  . Chronic nausea 10/06/2014  . Medicare annual wellness visit, subsequent 04/29/2014  . Abdominal pain, unspecified site 03/12/2014  . Dysuria 03/01/2014  . Hip pain, right 11/01/2013  . Rotator cuff arthropathy 07/13/2013  . Cervical radiculitis 07/13/2013  . Left shoulder pain 07/11/2013  . Pneumonia involving right lung 06/27/2013  . Hematuria, gross 04/12/2013  . Excessive sleepiness 12/10/2012  . Chronic vulvitis 11/16/2012  . Edema 08/08/2012  . Coronary artery disease 04/23/2012  . Benign esophageal stricture   . Invasive lobular carcinoma of breast, stage 1 ([place]) 02/14/2012  . Kidney disease, chronic, stage III (GFR 30-59 ml/min) 02/14/2012  . Cognitive complaints with normal neuropsychological exam 02/04/2012  . Diarrhea 01/23/2012  . Dysesthesia 08/19/2011  . History of MRI of brain and brain stem   . Hypertension 08/10/2011  . Anemia 02/03/2011    Past Surgical History:  Procedure Laterality Date  . ABDOMINAL HYSTERECTOMY  1962   due to hemorrhagic  . APPENDECTOMY  1948  . BLADDER SUSPENSION    . BREAST BIOPSY Right    positive 04/21/2012  . BREAST EXCISIONAL BIOPSY Right    positive 05/06/2012  . BREAST SURGERY  nov 2013   core biopsy Dr Burt Knack  . COLON RESECTION  2008   proximal due to precancerous polyp  . COLON SURGERY      Current Outpatient Rx  . Order #:  [id number] Class: Historical Med  . Order #: [id number] Class: Historical Med  . Order #: [id number] Class: Historical Med  . Order #: [id number] Class: Historical Med  . Order #: [id number] Class: Historical Med  . Order #: [id number] Class: Normal  . Order #: [id number] Class: Print  . Order #: [id number] Class: No Print  . Order #:  [id number] Class: Normal    Allergies [employer] [tretinoin]; Lipitor [atorvastatin calcium]; Meloxicam; Morphine and related; and Sulfa drugs cross reactors  Family History  Problem Relation Age of Onset  . Coronary artery disease Mother   . Cancer Sister     colon  . Breast cancer Sister     107's  . Coronary artery disease Brother     Social History Social History  Substance Use Topics  . Smoking status: Never Smoker  . Smokeless tobacco: Never Used  . Alcohol use No    Review of Systems Unable to obtain due to patient condition. ____________________________________________   PHYSICAL EXAM:  VITAL SIGNS: ED Triage Vitals  Enc Vitals Group     BP 06/05/2016 1930 (!) 191/73     Pulse Rate 06/07/2016 1945 81     Resp 06/13/2016 1945 20     Temp --      Temp src --      SpO2 05/29/2016 1945 100 %     Weight 06/05/2016 1949 110 lb 3.7 oz (50 kg)     Height 06/04/2016 1950 5' (1.524 m)     Head Circumference --      Peak Flow --      Pain Score --      Pain Loc --      Pain Edu? --      Excl. in [place]? --     Constitutional: Unresponsive. The corticated posturing. GCS 3. Eyes: Conjunctivae are normal.  Pupils are 3 mm, symmetric, and unresponsive.. No scleral icterus. Head: Atraumatic. No Battle sign or raccoon eyes. Nose: No congestion/rhinnorhea. Mouth/Throat: Mucous membranes are moist.  Neck: No stridor.  Supple.   Cardiovascular: Normal rate, regular rhythm. No murmurs, rubs or gallops.  Respiratory: Good breath sounds bilaterally with symmetric chest rise; agonal breathing supported by bag valve mask. Oxygen saturation 100%. Gastrointestinal: Soft, nontender and mildly distended..  No guarding or rebound.  No peritoneal signs. Musculoskeletal: No LE edema. . Neurologic: Unresponsive to painful stiffness. Occasional posturing. Moves all 4 extremities with painful stimulus. Face is symmetric. Pupils are unresponsive but symmetric. Not protective of her airway. Skin:  Skin is  warm, dry and intact. No rash noted. Psychiatric: Unable to assess  ____________________________________________   LABS (all labs ordered are listed, but only abnormal results are displayed)  Labs Reviewed  CBC - Abnormal; Notable for the following:       Result Value   RBC 3.31 (*)    Hemoglobin 10.0 (*)    HCT 29.6 (*)    All other components within normal limits  COMPREHENSIVE METABOLIC PANEL - Abnormal; Notable for the following:    Glucose, Bld 175 (*)    BUN 25 (*)    Creatinine, Ser 1.26 (*)    GFR calc non Af Amer 37 (*)    GFR calc Af Amer 43 (*)    All other components within normal limits  BLOOD GAS, ARTERIAL - Abnormal; Notable for the following:    pH, Arterial 7.47 (*)    pO2, Arterial 262 (*)    All other components within normal limits  LACTIC ACID, PLASMA  TROPONIN I  APTT  PROTIME-INR  LACTIC ACID, PLASMA  URINALYSIS, COMPLETE (UACMP) WITH MICROSCOPIC  TYPE AND SCREEN   ____________________________________________  EKG  Not indicated ____________________________________________  RADIOLOGY  Ct Head Wo Contrast  Result Date: 05/26/2016 CLINICAL DATA:  Collapse. EXAM: CT HEAD WITHOUT CONTRAST TECHNIQUE: Contiguous axial images were obtained from the base of the skull through the vertex without intravenous contrast. COMPARISON:  CT scan of June 25, 2015. FINDINGS: Brain: 7.6 x 6.2 cm intraparenchymal hemorrhage is noted arising from left basal ganglia. Extension of hemorrhage into the left lateral ventricle is noted as well as the third and fourth ventricles. Small amount of hemorrhage is noted in the right posterior horn. Dilatation of right lateral ventricle is noted. This results in 12 mm of left-to-right midline shift. Vascular: Atherosclerosis of carotid siphons is noted. Skull: Normal. Negative for fracture or focal lesion. Sinuses/Orbits: No acute finding. Other: None. IMPRESSION: Large intraparenchymal hemorrhage is seen arising from left basal  ganglia, with extension into left lateral ventricle, third and fourth ventricles. This results in significant dilatation of right lateral ventricle as well as 12 mm of left-to-right midline shift. Critical Value/emergent results were called by telephone at the time of interpretation on 05/25/2016 at 8:46 pm to Dr. Eula Listen , who verbally acknowledged these results. Electronically Signed   By: Marijo Conception, M.D.   On: 05/23/2016 20:47   Dg Chest Portable 1 View  Result Date: 05/28/2016 CLINICAL DATA:  Respiratory failure.  Intubated. EXAM: PORTABLE CHEST 1 VIEW COMPARISON:  CT chest 01/07/2016 and CXR 09/20/2014 FINDINGS: The tip of an endotracheal tube is seen approximately 3.3 cm above the carina. The lungs are hyperinflated without pneumonic consolidation or pneumothorax. No effusion or CHF. Cardiac size is within normal limits. There is aortic atherosclerosis. Soft tissue calcifications adjacent the right humeral head likely represent calcific bursitis. Degenerative subcortical cysts of the left humeral head and right glenoid. IMPRESSION: Endotracheal tube tip approximately 3.3 cm above the carina. Hyperinflated lungs without pneumothorax or pulmonary consolidation. Soft tissue calcifications adjacent to the right humeral head consistent calcific bursitis. Electronically Signed   By: Ashley Royalty M.D.   On: 05/30/2016 20:08    ____________________________________________   PROCEDURES  Procedure(s) performed: Intubation, see procedure note(s).  Procedures  Critical Care performed: Yes, see critical care note(s) ____________________________________________   INITIAL IMPRESSION / ASSESSMENT AND PLAN / ED COURSE  Pertinent labs & imaging results that were available during my care of the patient were reviewed by me and considered in my medical decision making (see chart for details).  82 y.o. female with unwitnessed collapse, possible seizure activity, significant hypertension and  GCS of 3. I'm concerned about an intracranial bleed in this patient, but there are other possibilities including embolic stroke.  ----------------------------------------- 9:24 PM on 05/28/2016 -----------------------------------------  After multiple discussions with the family, all 3 of the patient's children have opted to discontinue care. We will turn the ventilator off and removed the ET tube, stop all medications. The patient's caregivers at the bedside, and her children will come see the patient after these measures have been completed.  CRITICAL CARE Performed by: Eula Listen   Total critical care time: 90 minutes  Critical care time was exclusive of separately billable procedures and treating other patients.  Critical care was necessary to treat or prevent imminent or life-threatening deterioration.  Critical care was time spent personally by me on the following activities: development of treatment plan with patient and/or surrogate as well as nursing, discussions with consultants, evaluation of patient's response  to treatment, examination of patient, obtaining history from patient or surrogate, ordering and performing treatments and interventions, ordering and review of laboratory studies, ordering and review of radiographic studies, pulse oximetry and re-evaluation of patient's condition.  INTUBATION Performed by: Eula Listen  Required items: required blood products, implants, devices, and special equipment available Patient identity confirmed: provided demographic data and hospital-assigned identification number Time out: Immediately prior to procedure a "time out" was called to verify the correct patient, procedure, equipment, support staff and site/side marked as required.  Indications: GCS 3   Intubation method: Direct Laryngoscopy   Preoxygenation: BVM; 100%   Sedatives: 20mg  Etomidate Paralytic: 80mg  Succinylcholine  Tube Size: 7.5  cuffed  Post-procedure assessment: chest rise and ETCO2 monitor Breath sounds: equal and absent over the epigastrium Tube secured with: ETT holder Chest x-ray interpreted by radiologist and me.  Chest x-ray findings: endotracheal tube in appropriate position  Patient tolerated the procedure well with no immediate complications.    ____________________________________________  FINAL CLINICAL IMPRESSION(S) / ED DIAGNOSES  Final diagnoses:  Acute respiratory failure with hypoxia ([place])  Intraparenchymal hemorrhage of brain ([place])  Seizure ([place])    Clinical Course       NEW MEDICATIONS STARTED DURING THIS VISIT:  New Prescriptions   No medications on file      Eula Listen, MD 05/18/2016 2349

## 2016-05-21 MED ORDER — POLYVINYL ALCOHOL 1.4 % OP SOLN: 1.0000 [drp] | Freq: Four times a day (QID) | OPHTHALMIC | Status: DC | PRN

## 2016-05-21 MED ORDER — GLYCOPYRROLATE 0.2 MG/ML IJ SOLN: 0.2000 mg | INTRAMUSCULAR | Status: DC | PRN

## 2016-05-21 MED ORDER — HALOPERIDOL 0.5 MG PO TABS
0.5000 mg | ORAL_TABLET | ORAL | Status: DC | PRN
Start: 2016-05-21 — End: 2016-05-21

## 2016-05-21 MED ORDER — LORAZEPAM 2 MG/ML IJ SOLN: 1.0000 mg | INTRAMUSCULAR | Status: DC | PRN

## 2016-05-21 MED ORDER — ACETAMINOPHEN 325 MG PO TABS: 650.0000 mg | ORAL_TABLET | Freq: Four times a day (QID) | ORAL | Status: DC | PRN

## 2016-05-21 MED ORDER — HALOPERIDOL LACTATE 5 MG/ML IJ SOLN
0.5000 mg | INTRAMUSCULAR | Status: DC | PRN
  Administered 2016-05-21: 0.5 mg via INTRAVENOUS
  Filled 2016-05-21: qty 1

## 2016-05-21 MED ORDER — BIOTENE DRY MOUTH MT LIQD: 15.0000 mL | OROMUCOSAL | Status: DC | PRN

## 2016-05-21 MED ORDER — MORPHINE SULFATE (PF) 4 MG/ML IV SOLN
1.0000 mg | INTRAVENOUS | Status: DC | PRN
  Administered 2016-05-21: 1 mg via INTRAVENOUS
  Filled 2016-05-21: qty 1

## 2016-05-21 MED ORDER — GLYCOPYRROLATE 1 MG PO TABS: 1.0000 mg | ORAL_TABLET | ORAL | Status: DC | PRN

## 2016-05-21 MED ORDER — MORPHINE SULFATE (PF) 4 MG/ML IV SOLN
4.0000 mg | Freq: Once | INTRAVENOUS | Status: AC
  Administered 2016-05-21: 4 mg via INTRAVENOUS
  Filled 2016-05-21: qty 1

## 2016-05-21 MED ORDER — GLYCOPYRROLATE 0.2 MG/ML IJ SOLN
0.2000 mg | INTRAMUSCULAR | Status: DC | PRN
  Administered 2016-05-21: 0.2 mg via INTRAVENOUS
  Filled 2016-05-21: qty 1

## 2016-05-21 MED ORDER — ACETAMINOPHEN 650 MG RE SUPP: 650.0000 mg | Freq: Four times a day (QID) | RECTAL | Status: DC | PRN

## 2016-05-21 MED ORDER — ONDANSETRON 4 MG PO TBDP: 4.0000 mg | ORAL_TABLET | Freq: Four times a day (QID) | ORAL | Status: DC | PRN

## 2016-05-21 MED ORDER — ONDANSETRON HCL 4 MG/2ML IJ SOLN: 4.0000 mg | Freq: Four times a day (QID) | INTRAMUSCULAR | Status: DC | PRN

## 2016-05-21 MED ORDER — HALOPERIDOL LACTATE 2 MG/ML PO CONC: 0.5000 mg | ORAL | Status: DC | PRN

## 2016-06-16 NOTE — Progress Notes (Signed)
Pt not alert and drawing gasping breaths. Four family members in room. CH offered prayer and is available.   05-29-16 0055  Clinical Encounter Type  Visited With Patient and family together  Visit Type Spiritual support;Patient actively dying  Referral From Nurse  Spiritual Encounters  Spiritual Needs Prayer;Emotional;Grief support  Stress Factors  Patient Stress Factors None identified  Family Stress Factors Exhausted;Loss

## 2016-06-16 NOTE — Progress Notes (Signed)
Pt son came out to nurses station asking for this writer to come to pt room because he believed that she had taken her last breath. Went to pt room and discovered that pt had no respiratory effort. Pt DNR/NMP.Olena Mater RN came in to room to be second Psychologist, counselling. Continued to assess pt for signs of life. No apical, carotid or radial pulse was found. Asked family if they wanted the Chaplain to come back. They declined stating that he had just been there. Notified Dr Marcille Blanco via telephone of time of death at 54. AC Stephanie Brothers notified. [place] donor services declined donation.

## 2016-06-16 NOTE — Final Progress Note (Signed)
I talked with family regarding expectations of when funeral home will pick body up. Personal care taker Deri Fuelling was at bedside and I asked her exactly what happened as I was assessing if I should call the ME due to a fall prior to coming to the ED. Di Kindle states, "No, she did not fall. She was sitting on the commode and about to fall off but did not fall or hit her head. EMS told me to assist her to the floor which I did."

## 2016-06-16 NOTE — Discharge Summary (Signed)
Pt was admitted to hospitalist service with a diagnosis of intracranial hemorrhage-extensive intraparenchymal hemorrhage originating likely from her basal ganglia. Given the very minute possibility for any kind of meaningful recovery patient's family has opted for comfort measures. Please review h& p for details.   Pt was deceased under comfort care measures at 4 am even before my rounds (never seen the patient)

## 2016-06-16 DEATH — deceased
